# Patient Record
Sex: Male | Born: 1935 | Race: White | Hispanic: No | Marital: Married | Smoking: Never smoker
Health system: Southern US, Community
[De-identification: ages and names within clinical notes are randomized; demographics above are authoritative.]

## PROBLEM LIST (undated history)

## (undated) DIAGNOSIS — I10 Essential (primary) hypertension: Secondary | ICD-10-CM

## (undated) HISTORY — PX: CHOLECYSTECTOMY: SHX55

## (undated) HISTORY — PX: PACEMAKER INSERTION: SHX728

---

## 2003-08-26 ENCOUNTER — Other Ambulatory Visit: Payer: Self-pay

## 2003-12-25 ENCOUNTER — Other Ambulatory Visit: Payer: Self-pay

## 2004-10-20 ENCOUNTER — Emergency Department: Payer: Self-pay | Admitting: Emergency Medicine

## 2004-10-20 ENCOUNTER — Other Ambulatory Visit: Payer: Self-pay

## 2005-01-08 ENCOUNTER — Emergency Department: Payer: Self-pay | Admitting: Emergency Medicine

## 2005-01-09 ENCOUNTER — Other Ambulatory Visit: Payer: Self-pay

## 2005-10-10 ENCOUNTER — Emergency Department: Payer: Self-pay | Admitting: Emergency Medicine

## 2005-11-29 ENCOUNTER — Ambulatory Visit: Payer: Self-pay | Admitting: Gastroenterology

## 2007-01-28 ENCOUNTER — Ambulatory Visit: Payer: Self-pay | Admitting: Cardiology

## 2008-06-27 ENCOUNTER — Inpatient Hospital Stay: Payer: Self-pay | Admitting: Internal Medicine

## 2008-07-15 ENCOUNTER — Emergency Department: Payer: Self-pay | Admitting: Emergency Medicine

## 2010-01-08 ENCOUNTER — Inpatient Hospital Stay: Payer: Self-pay | Admitting: Internal Medicine

## 2010-03-08 ENCOUNTER — Inpatient Hospital Stay: Payer: Self-pay | Admitting: Internal Medicine

## 2011-06-07 ENCOUNTER — Emergency Department: Payer: Self-pay | Admitting: Unknown Physician Specialty

## 2011-06-07 LAB — CBC
MCV: 101 fL — ABNORMAL HIGH (ref 80–100)
Platelet: 325 10*3/uL (ref 150–440)
RBC: 4.25 10*6/uL — ABNORMAL LOW (ref 4.40–5.90)
RDW: 14.4 % (ref 11.5–14.5)
WBC: 6.6 10*3/uL (ref 3.8–10.6)

## 2011-06-07 LAB — URINALYSIS, COMPLETE
Bacteria: NONE SEEN
Blood: NEGATIVE
Ketone: NEGATIVE
Leukocyte Esterase: NEGATIVE
Nitrite: NEGATIVE
Ph: 6 (ref 4.5–8.0)
Protein: NEGATIVE
Specific Gravity: 1.006 (ref 1.003–1.030)
WBC UR: NONE SEEN /HPF (ref 0–5)

## 2011-06-07 LAB — COMPREHENSIVE METABOLIC PANEL
Albumin: 3.7 g/dL (ref 3.4–5.0)
Anion Gap: 12 (ref 7–16)
BUN: 20 mg/dL — ABNORMAL HIGH (ref 7–18)
Bilirubin,Total: 0.7 mg/dL (ref 0.2–1.0)
Calcium, Total: 9.5 mg/dL (ref 8.5–10.1)
Creatinine: 1.12 mg/dL (ref 0.60–1.30)
EGFR (African American): >60
EGFR (Non-African Amer.): >60
Glucose: 102 mg/dL — ABNORMAL HIGH (ref 65–99)
SGOT(AST): 38 U/L — ABNORMAL HIGH (ref 15–37)
SGPT (ALT): 17 U/L
Total Protein: 7.6 g/dL (ref 6.4–8.2)

## 2011-06-07 LAB — PROTIME-INR: Prothrombin Time: 12.4 secs (ref 11.5–14.7)

## 2011-06-07 LAB — LIPASE, BLOOD: Lipase: 203 U/L (ref 73–393)

## 2011-06-07 LAB — APTT: Activated PTT: 27.6 secs (ref 23.6–35.9)

## 2011-06-14 ENCOUNTER — Inpatient Hospital Stay: Payer: Self-pay | Admitting: Internal Medicine

## 2011-06-14 LAB — CBC
HCT: 37.1 % — ABNORMAL LOW (ref 40.0–52.0)
HGB: 12.4 g/dL — ABNORMAL LOW (ref 13.0–18.0)
MCH: 33.1 pg (ref 26.0–34.0)
MCV: 99 fL (ref 80–100)
RBC: 3.74 10*6/uL — ABNORMAL LOW (ref 4.40–5.90)
RDW: 14.3 % (ref 11.5–14.5)
WBC: 4.7 10*3/uL (ref 3.8–10.6)

## 2011-06-14 LAB — BASIC METABOLIC PANEL
BUN: 36 mg/dL — ABNORMAL HIGH (ref 7–18)
Calcium, Total: 9.2 mg/dL (ref 8.5–10.1)
Chloride: 104 mmol/L (ref 98–107)
Co2: 22 mmol/L (ref 21–32)
EGFR (African American): 33 — ABNORMAL LOW
Potassium: 5 mmol/L (ref 3.5–5.1)
Sodium: 139 mmol/L (ref 136–145)

## 2011-06-15 LAB — CBC WITH DIFFERENTIAL/PLATELET
Basophil #: 0 10*3/uL (ref 0.0–0.1)
Basophil %: 0.4 %
Eosinophil #: 0.1 10*3/uL (ref 0.0–0.7)
HCT: 37.7 % — ABNORMAL LOW (ref 40.0–52.0)
HGB: 12.3 g/dL — ABNORMAL LOW (ref 13.0–18.0)
Lymphocyte %: 21.6 %
MCH: 32.8 pg (ref 26.0–34.0)
MCHC: 32.7 g/dL (ref 32.0–36.0)
Monocyte #: 0.7 10*3/uL (ref 0.0–0.7)
Monocyte %: 13.9 %
Neutrophil #: 3.1 10*3/uL (ref 1.4–6.5)
Neutrophil %: 61.9 %
Platelet: 191 10*3/uL (ref 150–440)
RBC: 3.75 10*6/uL — ABNORMAL LOW (ref 4.40–5.90)
RDW: 14.4 % (ref 11.5–14.5)

## 2011-06-15 LAB — TROPONIN I: Troponin-I: 0.03 ng/mL

## 2011-06-15 LAB — BASIC METABOLIC PANEL
Anion Gap: 10 (ref 7–16)
BUN: 33 mg/dL — ABNORMAL HIGH (ref 7–18)
Calcium, Total: 9.2 mg/dL (ref 8.5–10.1)
Chloride: 107 mmol/L (ref 98–107)
Co2: 24 mmol/L (ref 21–32)
EGFR (Non-African Amer.): 29 — ABNORMAL LOW
Osmolality: 288 (ref 275–301)
Potassium: 4.8 mmol/L (ref 3.5–5.1)

## 2011-06-15 LAB — LIPID PANEL
Cholesterol: 130 mg/dL (ref 0–200)
HDL Cholesterol: 38 mg/dL — ABNORMAL LOW (ref 40–60)
Ldl Cholesterol, Calc: 75 mg/dL (ref 0–100)
VLDL Cholesterol, Calc: 17 mg/dL (ref 5–40)

## 2011-06-16 LAB — CBC WITH DIFFERENTIAL/PLATELET
Basophil #: 0 10*3/uL (ref 0.0–0.1)
Eosinophil #: 0.2 10*3/uL (ref 0.0–0.7)
HCT: 37.1 % — ABNORMAL LOW (ref 40.0–52.0)
Lymphocyte #: 1.2 10*3/uL (ref 1.0–3.6)
MCH: 32.9 pg (ref 26.0–34.0)
MCHC: 32.5 g/dL (ref 32.0–36.0)
MCV: 101 fL — ABNORMAL HIGH (ref 80–100)
Monocyte #: 0.6 10*3/uL (ref 0.0–0.7)
Neutrophil #: 1.9 10*3/uL (ref 1.4–6.5)
RDW: 14.1 % (ref 11.5–14.5)

## 2011-06-16 LAB — BASIC METABOLIC PANEL
BUN: 26 mg/dL — ABNORMAL HIGH (ref 7–18)
Calcium, Total: 9.4 mg/dL (ref 8.5–10.1)
Co2: 23 mmol/L (ref 21–32)
Creatinine: 1.45 mg/dL — ABNORMAL HIGH (ref 0.60–1.30)
EGFR (African American): 59 — ABNORMAL LOW
EGFR (Non-African Amer.): 48 — ABNORMAL LOW
Glucose: 98 mg/dL (ref 65–99)
Potassium: 4.5 mmol/L (ref 3.5–5.1)
Sodium: 141 mmol/L (ref 136–145)

## 2011-06-17 LAB — BASIC METABOLIC PANEL
BUN: 21 mg/dL — ABNORMAL HIGH (ref 7–18)
Calcium, Total: 9.4 mg/dL (ref 8.5–10.1)
Chloride: 109 mmol/L — ABNORMAL HIGH (ref 98–107)
Creatinine: 1.29 mg/dL (ref 0.60–1.30)
EGFR (African American): >60
EGFR (Non-African Amer.): 55 — ABNORMAL LOW
Glucose: 98 mg/dL (ref 65–99)
Osmolality: 282 (ref 275–301)
Potassium: 4.7 mmol/L (ref 3.5–5.1)
Sodium: 140 mmol/L (ref 136–145)

## 2011-10-07 ENCOUNTER — Emergency Department: Payer: Self-pay | Admitting: *Deleted

## 2011-10-07 LAB — CBC
HGB: 12.4 g/dL — ABNORMAL LOW (ref 13.0–18.0)
MCH: 32.7 pg (ref 26.0–34.0)
MCHC: 33.4 g/dL (ref 32.0–36.0)
Platelet: 213 10*3/uL (ref 150–440)
RBC: 3.79 10*6/uL — ABNORMAL LOW (ref 4.40–5.90)
RDW: 13.8 % (ref 11.5–14.5)

## 2011-10-07 LAB — TROPONIN I: Troponin-I: 0.03 ng/mL

## 2011-10-07 LAB — COMPREHENSIVE METABOLIC PANEL
Albumin: 3.4 g/dL (ref 3.4–5.0)
Alkaline Phosphatase: 110 U/L (ref 50–136)
Chloride: 105 mmol/L (ref 98–107)
Creatinine: 1.14 mg/dL (ref 0.60–1.30)
EGFR (African American): >60
Glucose: 94 mg/dL (ref 65–99)
Potassium: 4.2 mmol/L (ref 3.5–5.1)
SGOT(AST): 26 U/L (ref 15–37)
SGPT (ALT): 17 U/L
Sodium: 139 mmol/L (ref 136–145)
Total Protein: 7.4 g/dL (ref 6.4–8.2)

## 2011-10-10 ENCOUNTER — Emergency Department: Payer: Self-pay | Admitting: Emergency Medicine

## 2011-10-10 LAB — CK TOTAL AND CKMB (NOT AT ARMC)
CK, Total: 59 U/L (ref 35–232)
CK-MB: 1.8 ng/mL (ref 0.5–3.6)

## 2011-10-10 LAB — COMPREHENSIVE METABOLIC PANEL
Alkaline Phosphatase: 111 U/L (ref 50–136)
Bilirubin,Total: 0.8 mg/dL (ref 0.2–1.0)
Calcium, Total: 9.4 mg/dL (ref 8.5–10.1)
Chloride: 105 mmol/L (ref 98–107)
Co2: 23 mmol/L (ref 21–32)
Creatinine: 1.25 mg/dL (ref 0.60–1.30)
EGFR (African American): 57 — ABNORMAL LOW
Osmolality: 277 (ref 275–301)
SGOT(AST): 28 U/L (ref 15–37)
SGPT (ALT): 21 U/L

## 2011-10-10 LAB — CBC
HCT: 38.2 % — ABNORMAL LOW (ref 40.0–52.0)
RBC: 3.84 10*6/uL — ABNORMAL LOW (ref 4.40–5.90)
RDW: 13.5 % (ref 11.5–14.5)
WBC: 6 10*3/uL (ref 3.8–10.6)

## 2011-10-10 LAB — PRO B NATRIURETIC PEPTIDE: B-Type Natriuretic Peptide: 4641 pg/mL — ABNORMAL HIGH (ref 0–450)

## 2012-03-13 ENCOUNTER — Observation Stay: Payer: Self-pay | Admitting: Family Medicine

## 2012-03-13 LAB — CBC
Platelet: 209 10*3/uL (ref 150–440)
RBC: 3.65 10*6/uL — ABNORMAL LOW (ref 4.40–5.90)
RDW: 15 % — ABNORMAL HIGH (ref 11.5–14.5)
WBC: 5.5 10*3/uL (ref 3.8–10.6)

## 2012-03-13 LAB — URINALYSIS, COMPLETE
Bilirubin,UR: NEGATIVE
Glucose,UR: NEGATIVE mg/dL (ref 0–75)
Leukocyte Esterase: NEGATIVE
Ph: 5 (ref 4.5–8.0)
Specific Gravity: 1.017 (ref 1.003–1.030)
Squamous Epithelial: NONE SEEN
WBC UR: <1 /HPF (ref 0–5)

## 2012-03-13 LAB — BASIC METABOLIC PANEL
BUN: 15 mg/dL (ref 7–18)
Chloride: 106 mmol/L (ref 98–107)
EGFR (Non-African Amer.): 49 — ABNORMAL LOW
Glucose: 102 mg/dL — ABNORMAL HIGH (ref 65–99)
Osmolality: 277 (ref 275–301)
Potassium: 4.4 mmol/L (ref 3.5–5.1)
Sodium: 138 mmol/L (ref 136–145)

## 2012-03-13 LAB — HEPATIC FUNCTION PANEL A (ARMC)
Albumin: 3.3 g/dL — ABNORMAL LOW (ref 3.4–5.0)
Alkaline Phosphatase: 101 U/L (ref 50–136)
Bilirubin,Total: 0.6 mg/dL (ref 0.2–1.0)
SGOT(AST): 24 U/L (ref 15–37)
SGPT (ALT): 16 U/L (ref 12–78)
Total Protein: 7 g/dL (ref 6.4–8.2)

## 2012-03-13 LAB — CK TOTAL AND CKMB (NOT AT ARMC)
CK, Total: 55 U/L (ref 35–232)
CK, Total: 40 U/L (ref 35–232)
CK-MB: 1.6 ng/mL (ref 0.5–3.6)

## 2012-03-13 LAB — LIPASE, BLOOD: Lipase: 140 U/L (ref 73–393)

## 2012-03-14 LAB — CK TOTAL AND CKMB (NOT AT ARMC)
CK, Total: 40 U/L (ref 35–232)
CK-MB: 1.9 ng/mL (ref 0.5–3.6)

## 2012-03-14 LAB — CBC WITH DIFFERENTIAL/PLATELET
Basophil %: 0.6 %
Eosinophil %: 3.4 %
HCT: 34.6 % — ABNORMAL LOW (ref 40.0–52.0)
HGB: 11.7 g/dL — ABNORMAL LOW (ref 13.0–18.0)
Lymphocyte #: 1 10*3/uL (ref 1.0–3.6)
MCH: 33.6 pg (ref 26.0–34.0)
MCV: 100 fL (ref 80–100)
Monocyte #: 0.7 x10 3/mm (ref 0.2–1.0)
Monocyte %: 15.3 %
Neutrophil #: 2.5 10*3/uL (ref 1.4–6.5)
Neutrophil %: 58.4 %
RBC: 3.48 10*6/uL — ABNORMAL LOW (ref 4.40–5.90)
WBC: 4.3 10*3/uL (ref 3.8–10.6)

## 2012-03-14 LAB — BASIC METABOLIC PANEL
Anion Gap: 7 (ref 7–16)
Calcium, Total: 8.5 mg/dL (ref 8.5–10.1)
Chloride: 107 mmol/L (ref 98–107)
Co2: 25 mmol/L (ref 21–32)
EGFR (Non-African Amer.): 47 — ABNORMAL LOW
Glucose: 86 mg/dL (ref 65–99)
Osmolality: 278 (ref 275–301)
Potassium: 4.2 mmol/L (ref 3.5–5.1)
Sodium: 139 mmol/L (ref 136–145)

## 2012-03-14 LAB — TROPONIN I: Troponin-I: 0.08 ng/mL — ABNORMAL HIGH

## 2012-05-02 LAB — CBC
HCT: 34.2 % — ABNORMAL LOW (ref 40.0–52.0)
MCHC: 34.4 g/dL (ref 32.0–36.0)
Platelet: 300 10*3/uL (ref 150–440)
RBC: 3.45 10*6/uL — ABNORMAL LOW (ref 4.40–5.90)
RDW: 14.1 % (ref 11.5–14.5)

## 2012-05-02 LAB — COMPREHENSIVE METABOLIC PANEL
Albumin: 3.1 g/dL — ABNORMAL LOW (ref 3.4–5.0)
Anion Gap: 7 (ref 7–16)
BUN: 16 mg/dL (ref 7–18)
Bilirubin,Total: 0.6 mg/dL (ref 0.2–1.0)
Chloride: 101 mmol/L (ref 98–107)
Creatinine: 1.31 mg/dL — ABNORMAL HIGH (ref 0.60–1.30)
EGFR (African American): 54 — ABNORMAL LOW
EGFR (Non-African Amer.): 46 — ABNORMAL LOW
Glucose: 103 mg/dL — ABNORMAL HIGH (ref 65–99)
Osmolality: 277 (ref 275–301)
Potassium: 3.8 mmol/L (ref 3.5–5.1)
SGOT(AST): 23 U/L (ref 15–37)
SGPT (ALT): 15 U/L (ref 12–78)
Sodium: 138 mmol/L (ref 136–145)
Total Protein: 7.2 g/dL (ref 6.4–8.2)

## 2012-05-03 ENCOUNTER — Ambulatory Visit: Payer: Self-pay | Admitting: Orthopedic Surgery

## 2012-05-04 ENCOUNTER — Inpatient Hospital Stay: Payer: Self-pay

## 2012-05-04 LAB — COMPREHENSIVE METABOLIC PANEL
Albumin: 2.4 g/dL — ABNORMAL LOW (ref 3.4–5.0)
Anion Gap: 5 — ABNORMAL LOW (ref 7–16)
Calcium, Total: 8.7 mg/dL (ref 8.5–10.1)
Chloride: 103 mmol/L (ref 98–107)
Co2: 30 mmol/L (ref 21–32)
EGFR (African American): 58 — ABNORMAL LOW
Glucose: 103 mg/dL — ABNORMAL HIGH (ref 65–99)
Osmolality: 277 (ref 275–301)
Potassium: 3.8 mmol/L (ref 3.5–5.1)
SGOT(AST): 16 U/L (ref 15–37)
SGPT (ALT): 13 U/L (ref 12–78)

## 2012-05-04 LAB — CBC WITH DIFFERENTIAL/PLATELET
Eosinophil %: 5.9 %
Lymphocyte %: 16.9 %
Monocyte #: 0.9 x10 3/mm (ref 0.2–1.0)
Monocyte %: 14.1 %
Neutrophil #: 3.8 10*3/uL (ref 1.4–6.5)
Neutrophil %: 62.6 %
Platelet: 231 10*3/uL (ref 150–440)
WBC: 6.1 10*3/uL (ref 3.8–10.6)

## 2012-05-04 LAB — MAGNESIUM: Magnesium: 1.8 mg/dL

## 2012-05-05 LAB — CBC WITH DIFFERENTIAL/PLATELET
Basophil #: 0 10*3/uL (ref 0.0–0.1)
Basophil %: 0.4 %
Eosinophil #: 0.2 10*3/uL (ref 0.0–0.7)
HCT: 27.9 % — ABNORMAL LOW (ref 40.0–52.0)
HGB: 9.6 g/dL — ABNORMAL LOW (ref 13.0–18.0)
Lymphocyte %: 17 %
MCH: 33.9 pg (ref 26.0–34.0)
MCHC: 34.2 g/dL (ref 32.0–36.0)
Monocyte #: 0.9 x10 3/mm (ref 0.2–1.0)
Monocyte %: 14.7 %
Neutrophil #: 4 10*3/uL (ref 1.4–6.5)
Neutrophil %: 65.1 %
Platelet: 232 10*3/uL (ref 150–440)
WBC: 6.1 10*3/uL (ref 3.8–10.6)

## 2012-05-06 LAB — BASIC METABOLIC PANEL
BUN: 16 mg/dL (ref 7–18)
Calcium, Total: 8.9 mg/dL (ref 8.5–10.1)
Chloride: 101 mmol/L (ref 98–107)
Co2: 26 mmol/L (ref 21–32)
Creatinine: 1.02 mg/dL (ref 0.60–1.30)
EGFR (Non-African Amer.): >60
Osmolality: 267 (ref 275–301)
Potassium: 4.3 mmol/L (ref 3.5–5.1)
Sodium: 133 mmol/L — ABNORMAL LOW (ref 136–145)

## 2012-05-06 LAB — CBC WITH DIFFERENTIAL/PLATELET
Basophil %: 0.4 %
Eosinophil #: 0.2 10*3/uL (ref 0.0–0.7)
HCT: 27.4 % — ABNORMAL LOW (ref 40.0–52.0)
Lymphocyte %: 12.7 %
MCH: 34.4 pg — ABNORMAL HIGH (ref 26.0–34.0)
MCV: 99 fL (ref 80–100)
Monocyte %: 13.8 %
Platelet: 236 10*3/uL (ref 150–440)
RBC: 2.78 10*6/uL — ABNORMAL LOW (ref 4.40–5.90)
RDW: 13.6 % (ref 11.5–14.5)

## 2012-05-07 LAB — COMPREHENSIVE METABOLIC PANEL
Alkaline Phosphatase: 213 U/L — ABNORMAL HIGH (ref 50–136)
BUN: 18 mg/dL (ref 7–18)
Bilirubin,Total: 0.8 mg/dL (ref 0.2–1.0)
Chloride: 100 mmol/L (ref 98–107)
Creatinine: 1.09 mg/dL (ref 0.60–1.30)
SGPT (ALT): 11 U/L — ABNORMAL LOW (ref 12–78)
Sodium: 134 mmol/L — ABNORMAL LOW (ref 136–145)
Total Protein: 5.7 g/dL — ABNORMAL LOW (ref 6.4–8.2)

## 2012-05-07 LAB — CBC WITH DIFFERENTIAL/PLATELET
Basophil #: 0.1 10*3/uL (ref 0.0–0.1)
Basophil %: 1.2 %
Lymphocyte %: 11.5 %
MCH: 35.7 pg — ABNORMAL HIGH (ref 26.0–34.0)
MCHC: 36.1 g/dL — ABNORMAL HIGH (ref 32.0–36.0)
Monocyte %: 13.9 %
Neutrophil #: 3.7 10*3/uL (ref 1.4–6.5)
Neutrophil %: 71.5 %
Platelet: 260 10*3/uL (ref 150–440)
RBC: 2.84 10*6/uL — ABNORMAL LOW (ref 4.40–5.90)
RDW: 13.7 % (ref 11.5–14.5)
WBC: 5.2 10*3/uL (ref 3.8–10.6)

## 2012-05-09 ENCOUNTER — Encounter: Payer: Self-pay | Admitting: Internal Medicine

## 2012-06-01 ENCOUNTER — Encounter: Payer: Self-pay | Admitting: Internal Medicine

## 2012-06-01 LAB — URINALYSIS, COMPLETE
Bacteria: NONE SEEN
Bilirubin,UR: NEGATIVE
Glucose,UR: NEGATIVE mg/dL (ref 0–75)
Hyaline Cast: 7
Ketone: NEGATIVE
Leukocyte Esterase: NEGATIVE
Nitrite: NEGATIVE
Ph: 5 (ref 4.5–8.0)
Protein: NEGATIVE
RBC,UR: <1 /HPF (ref 0–5)

## 2012-06-11 LAB — CBC WITH DIFFERENTIAL/PLATELET
Basophil %: 0.6 %
HCT: 38.7 % — ABNORMAL LOW (ref 40.0–52.0)
MCHC: 33.6 g/dL (ref 32.0–36.0)
MCV: 99 fL (ref 80–100)
Neutrophil %: 62.5 %
Platelet: 272 10*3/uL (ref 150–440)
RDW: 14.4 % (ref 11.5–14.5)
WBC: 5.3 10*3/uL (ref 3.8–10.6)

## 2012-06-11 LAB — BASIC METABOLIC PANEL
BUN: 20 mg/dL — ABNORMAL HIGH (ref 7–18)
Calcium, Total: 9.9 mg/dL (ref 8.5–10.1)
Chloride: 103 mmol/L (ref 98–107)
Creatinine: 1.43 mg/dL — ABNORMAL HIGH (ref 0.60–1.30)
EGFR (Non-African Amer.): 42 — ABNORMAL LOW
Osmolality: 275 (ref 275–301)
Potassium: 4.1 mmol/L (ref 3.5–5.1)
Sodium: 136 mmol/L (ref 136–145)

## 2012-06-13 ENCOUNTER — Emergency Department: Payer: Self-pay | Admitting: Emergency Medicine

## 2012-06-13 LAB — TROPONIN I: Troponin-I: 0.04 ng/mL

## 2012-06-13 LAB — CBC
HCT: 39.2 % — ABNORMAL LOW (ref 40.0–52.0)
MCH: 32.8 pg (ref 26.0–34.0)
MCHC: 33.2 g/dL (ref 32.0–36.0)
Platelet: 296 10*3/uL (ref 150–440)
RDW: 14.6 % — ABNORMAL HIGH (ref 11.5–14.5)
WBC: 6.3 10*3/uL (ref 3.8–10.6)

## 2012-06-13 LAB — COMPREHENSIVE METABOLIC PANEL
Albumin: 3.3 g/dL — ABNORMAL LOW (ref 3.4–5.0)
Alkaline Phosphatase: 218 U/L — ABNORMAL HIGH (ref 50–136)
Calcium, Total: 9.8 mg/dL (ref 8.5–10.1)
Chloride: 104 mmol/L (ref 98–107)
Co2: 27 mmol/L (ref 21–32)
Creatinine: 1.47 mg/dL — ABNORMAL HIGH (ref 0.60–1.30)
EGFR (African American): 47 — ABNORMAL LOW
Glucose: 112 mg/dL — ABNORMAL HIGH (ref 65–99)
Osmolality: 280 (ref 275–301)
SGOT(AST): 25 U/L (ref 15–37)
Sodium: 138 mmol/L (ref 136–145)

## 2012-06-13 LAB — PROTIME-INR: Prothrombin Time: 14.4 secs (ref 11.5–14.7)

## 2012-06-13 LAB — CK TOTAL AND CKMB (NOT AT ARMC): CK-MB: <0.5 ng/mL — ABNORMAL LOW (ref 0.5–3.6)

## 2012-06-20 ENCOUNTER — Encounter: Payer: Self-pay | Admitting: Internal Medicine

## 2012-09-05 ENCOUNTER — Emergency Department: Payer: Self-pay | Admitting: Unknown Physician Specialty

## 2012-09-06 LAB — CBC
HCT: 38.2 % — ABNORMAL LOW (ref 40.0–52.0)
HGB: 12.7 g/dL — ABNORMAL LOW (ref 13.0–18.0)
MCH: 33.3 pg (ref 26.0–34.0)
MCHC: 33.2 g/dL (ref 32.0–36.0)
Platelet: 216 10*3/uL (ref 150–440)
RDW: 16.4 % — ABNORMAL HIGH (ref 11.5–14.5)
WBC: 9.4 10*3/uL (ref 3.8–10.6)

## 2012-09-06 LAB — COMPREHENSIVE METABOLIC PANEL
Alkaline Phosphatase: 148 U/L — ABNORMAL HIGH (ref 50–136)
Anion Gap: 7 (ref 7–16)
BUN: 19 mg/dL — ABNORMAL HIGH (ref 7–18)
Calcium, Total: 9.5 mg/dL (ref 8.5–10.1)
Creatinine: 1.73 mg/dL — ABNORMAL HIGH (ref 0.60–1.30)
Glucose: 125 mg/dL — ABNORMAL HIGH (ref 65–99)
Osmolality: 285 (ref 275–301)
SGOT(AST): 17 U/L (ref 15–37)
SGPT (ALT): 12 U/L (ref 12–78)

## 2012-12-29 ENCOUNTER — Emergency Department: Payer: Self-pay | Admitting: Unknown Physician Specialty

## 2012-12-29 LAB — BASIC METABOLIC PANEL
Anion Gap: 8 (ref 7–16)
Calcium, Total: 9.5 mg/dL (ref 8.5–10.1)
Chloride: 106 mmol/L (ref 98–107)
Co2: 28 mmol/L (ref 21–32)
Creatinine: 2.21 mg/dL — ABNORMAL HIGH (ref 0.60–1.30)
EGFR (African American): 28 — ABNORMAL LOW
EGFR (Non-African Amer.): 24 — ABNORMAL LOW
Glucose: 105 mg/dL — ABNORMAL HIGH (ref 65–99)
Potassium: 4.3 mmol/L (ref 3.5–5.1)
Sodium: 142 mmol/L (ref 136–145)

## 2012-12-29 LAB — CBC
HCT: 32.5 % — ABNORMAL LOW (ref 40.0–52.0)
MCH: 32.8 pg (ref 26.0–34.0)
MCV: 96 fL (ref 80–100)
Platelet: 252 10*3/uL (ref 150–440)
RDW: 14.9 % — ABNORMAL HIGH (ref 11.5–14.5)
WBC: 6 10*3/uL (ref 3.8–10.6)

## 2012-12-29 LAB — PROTIME-INR: Prothrombin Time: 13.2 secs (ref 11.5–14.7)

## 2013-10-14 ENCOUNTER — Inpatient Hospital Stay: Payer: Self-pay

## 2013-10-14 LAB — CBC
HCT: 23.1 % — ABNORMAL LOW (ref 40.0–52.0)
HGB: 7.5 g/dL — AB (ref 13.0–18.0)
MCH: 32 pg (ref 26.0–34.0)
MCHC: 32.6 g/dL (ref 32.0–36.0)
MCV: 98 fL (ref 80–100)
PLATELETS: 176 10*3/uL (ref 150–440)
RBC: 2.35 10*6/uL — ABNORMAL LOW (ref 4.40–5.90)
RDW: 15.7 % — ABNORMAL HIGH (ref 11.5–14.5)
WBC: 6.6 10*3/uL (ref 3.8–10.6)

## 2013-10-14 LAB — COMPREHENSIVE METABOLIC PANEL
Albumin: 3 g/dL — ABNORMAL LOW (ref 3.4–5.0)
Alkaline Phosphatase: 137 U/L — ABNORMAL HIGH
Anion Gap: 7 (ref 7–16)
BILIRUBIN TOTAL: 0.6 mg/dL (ref 0.2–1.0)
BUN: 34 mg/dL — ABNORMAL HIGH (ref 7–18)
Calcium, Total: 8.8 mg/dL (ref 8.5–10.1)
Chloride: 108 mmol/L — ABNORMAL HIGH (ref 98–107)
Co2: 24 mmol/L (ref 21–32)
Creatinine: 1.84 mg/dL — ABNORMAL HIGH (ref 0.60–1.30)
GFR CALC AF AMER: 35 — AB
GFR CALC NON AF AMER: 30 — AB
Glucose: 96 mg/dL (ref 65–99)
Osmolality: 285 (ref 275–301)
Potassium: 3.8 mmol/L (ref 3.5–5.1)
SGOT(AST): 56 U/L — ABNORMAL HIGH (ref 15–37)
SGPT (ALT): 70 U/L (ref 12–78)
SODIUM: 139 mmol/L (ref 136–145)
Total Protein: 6.2 g/dL — ABNORMAL LOW (ref 6.4–8.2)

## 2013-10-14 LAB — APTT: ACTIVATED PTT: 28.5 s (ref 23.6–35.9)

## 2013-10-14 LAB — PROTIME-INR
INR: 1.2
Prothrombin Time: 14.9 secs — ABNORMAL HIGH (ref 11.5–14.7)

## 2013-10-14 LAB — HEMATOCRIT: HCT: 25.8 % — ABNORMAL LOW (ref 40.0–52.0)

## 2013-10-14 LAB — HEMOGLOBIN: HGB: 8.5 g/dL — ABNORMAL LOW (ref 13.0–18.0)

## 2013-10-15 LAB — CBC WITH DIFFERENTIAL/PLATELET
Basophil #: 0 10*3/uL (ref 0.0–0.1)
Basophil %: 0.4 %
EOS PCT: 3.3 %
Eosinophil #: 0.2 10*3/uL (ref 0.0–0.7)
HCT: 22.4 % — ABNORMAL LOW (ref 40.0–52.0)
HGB: 7.5 g/dL — ABNORMAL LOW (ref 13.0–18.0)
LYMPHS PCT: 15.5 %
Lymphocyte #: 1 10*3/uL (ref 1.0–3.6)
MCH: 31.6 pg (ref 26.0–34.0)
MCHC: 33.6 g/dL (ref 32.0–36.0)
MCV: 94 fL (ref 80–100)
MONO ABS: 1.1 x10 3/mm — AB (ref 0.2–1.0)
Monocyte %: 16 %
NEUTROS PCT: 64.8 %
Neutrophil #: 4.3 10*3/uL (ref 1.4–6.5)
Platelet: 157 10*3/uL (ref 150–440)
RBC: 2.38 10*6/uL — ABNORMAL LOW (ref 4.40–5.90)
RDW: 17.6 % — AB (ref 11.5–14.5)
WBC: 6.7 10*3/uL (ref 3.8–10.6)

## 2013-10-15 LAB — BASIC METABOLIC PANEL
ANION GAP: 6 — AB (ref 7–16)
BUN: 32 mg/dL — ABNORMAL HIGH (ref 7–18)
Calcium, Total: 8.2 mg/dL — ABNORMAL LOW (ref 8.5–10.1)
Chloride: 110 mmol/L — ABNORMAL HIGH (ref 98–107)
Co2: 25 mmol/L (ref 21–32)
Creatinine: 1.67 mg/dL — ABNORMAL HIGH (ref 0.60–1.30)
EGFR (Non-African Amer.): 34 — ABNORMAL LOW
GFR CALC AF AMER: 39 — AB
Glucose: 105 mg/dL — ABNORMAL HIGH (ref 65–99)
Osmolality: 289 (ref 275–301)
Potassium: 3.7 mmol/L (ref 3.5–5.1)
Sodium: 141 mmol/L (ref 136–145)

## 2013-10-15 LAB — MAGNESIUM: Magnesium: 1.9 mg/dL

## 2013-10-15 LAB — HEMOGLOBIN
HGB: 7.7 g/dL — ABNORMAL LOW (ref 13.0–18.0)
HGB: 8 g/dL — ABNORMAL LOW (ref 13.0–18.0)

## 2013-10-16 LAB — HEMOGLOBIN
HGB: 6.9 g/dL — ABNORMAL LOW (ref 13.0–18.0)
HGB: 9.2 g/dL — AB (ref 13.0–18.0)

## 2013-10-17 LAB — BASIC METABOLIC PANEL
ANION GAP: 6 — AB (ref 7–16)
BUN: 18 mg/dL (ref 7–18)
CO2: 22 mmol/L (ref 21–32)
CREATININE: 1.22 mg/dL (ref 0.60–1.30)
Calcium, Total: 8.3 mg/dL — ABNORMAL LOW (ref 8.5–10.1)
Chloride: 112 mmol/L — ABNORMAL HIGH (ref 98–107)
EGFR (African American): 58 — ABNORMAL LOW
GFR CALC NON AF AMER: 50 — AB
Glucose: 95 mg/dL (ref 65–99)
OSMOLALITY: 281 (ref 275–301)
POTASSIUM: 3.9 mmol/L (ref 3.5–5.1)
SODIUM: 140 mmol/L (ref 136–145)

## 2013-10-17 LAB — HEMOGLOBIN: HGB: 7.7 g/dL — ABNORMAL LOW (ref 13.0–18.0)

## 2013-10-18 ENCOUNTER — Ambulatory Visit: Payer: Self-pay | Admitting: Internal Medicine

## 2013-10-18 LAB — HEMOGLOBIN
HGB: 8.3 g/dL — ABNORMAL LOW (ref 13.0–18.0)
HGB: 8.7 g/dL — AB (ref 13.0–18.0)

## 2013-10-18 LAB — BASIC METABOLIC PANEL
ANION GAP: 5 — AB (ref 7–16)
BUN: 17 mg/dL (ref 7–18)
CALCIUM: 8.5 mg/dL (ref 8.5–10.1)
Chloride: 116 mmol/L — ABNORMAL HIGH (ref 98–107)
Co2: 20 mmol/L — ABNORMAL LOW (ref 21–32)
Creatinine: 1.23 mg/dL (ref 0.60–1.30)
GFR CALC AF AMER: 57 — AB
GFR CALC NON AF AMER: 49 — AB
Glucose: 101 mg/dL — ABNORMAL HIGH (ref 65–99)
Osmolality: 283 (ref 275–301)
POTASSIUM: 4 mmol/L (ref 3.5–5.1)
Sodium: 141 mmol/L (ref 136–145)

## 2013-10-19 LAB — HEMOGLOBIN: HGB: 8 g/dL — ABNORMAL LOW (ref 13.0–18.0)

## 2013-10-20 LAB — HEMOGLOBIN: HGB: 8 g/dL — ABNORMAL LOW (ref 13.0–18.0)

## 2013-10-20 LAB — BASIC METABOLIC PANEL
ANION GAP: 6 — AB (ref 7–16)
BUN: 15 mg/dL (ref 7–18)
CHLORIDE: 113 mmol/L — AB (ref 98–107)
Calcium, Total: 8.8 mg/dL (ref 8.5–10.1)
Co2: 21 mmol/L (ref 21–32)
Creatinine: 1.31 mg/dL — ABNORMAL HIGH (ref 0.60–1.30)
EGFR (Non-African Amer.): 46 — ABNORMAL LOW
GFR CALC AF AMER: 53 — AB
Glucose: 112 mg/dL — ABNORMAL HIGH (ref 65–99)
OSMOLALITY: 281 (ref 275–301)
POTASSIUM: 4 mmol/L (ref 3.5–5.1)
Sodium: 140 mmol/L (ref 136–145)

## 2013-10-21 ENCOUNTER — Inpatient Hospital Stay: Payer: Self-pay | Admitting: Internal Medicine

## 2013-10-21 DIAGNOSIS — K922 Gastrointestinal hemorrhage, unspecified: Secondary | ICD-10-CM

## 2013-10-21 DIAGNOSIS — F3289 Other specified depressive episodes: Secondary | ICD-10-CM

## 2013-10-21 DIAGNOSIS — R404 Transient alteration of awareness: Secondary | ICD-10-CM

## 2013-10-21 DIAGNOSIS — I2510 Atherosclerotic heart disease of native coronary artery without angina pectoris: Secondary | ICD-10-CM

## 2013-10-21 DIAGNOSIS — R609 Edema, unspecified: Secondary | ICD-10-CM

## 2013-10-21 DIAGNOSIS — F329 Major depressive disorder, single episode, unspecified: Secondary | ICD-10-CM

## 2013-10-21 LAB — CBC WITH DIFFERENTIAL/PLATELET
BASOS ABS: 0 10*3/uL (ref 0.0–0.1)
Basophil %: 0.4 %
EOS PCT: 0 %
Eosinophil #: 0 10*3/uL (ref 0.0–0.7)
HCT: 26.5 % — AB (ref 40.0–52.0)
HGB: 8.4 g/dL — AB (ref 13.0–18.0)
LYMPHS PCT: 1.9 %
Lymphocyte #: 0.2 10*3/uL — ABNORMAL LOW (ref 1.0–3.6)
MCH: 30.9 pg (ref 26.0–34.0)
MCHC: 31.8 g/dL — ABNORMAL LOW (ref 32.0–36.0)
MCV: 97 fL (ref 80–100)
Monocyte #: 0.8 x10 3/mm (ref 0.2–1.0)
Monocyte %: 7.2 %
Neutrophil #: 10.3 10*3/uL — ABNORMAL HIGH (ref 1.4–6.5)
Neutrophil %: 90.5 %
PLATELETS: 272 10*3/uL (ref 150–440)
RBC: 2.73 10*6/uL — ABNORMAL LOW (ref 4.40–5.90)
RDW: 18.9 % — ABNORMAL HIGH (ref 11.5–14.5)
WBC: 11.4 10*3/uL — ABNORMAL HIGH (ref 3.8–10.6)

## 2013-10-21 LAB — URINALYSIS, COMPLETE
BILIRUBIN, UR: NEGATIVE
Blood: NEGATIVE
Glucose,UR: NEGATIVE mg/dL (ref 0–75)
Granular Cast: 2
Hyaline Cast: 15
KETONE: NEGATIVE
LEUKOCYTE ESTERASE: NEGATIVE
NITRITE: NEGATIVE
Ph: 5 (ref 4.5–8.0)
Protein: NEGATIVE
RBC,UR: <1 /HPF (ref 0–5)
Specific Gravity: 1.008 (ref 1.003–1.030)
Squamous Epithelial: NONE SEEN
WBC UR: <1 /HPF (ref 0–5)

## 2013-10-21 LAB — COMPREHENSIVE METABOLIC PANEL
ALT: 33 U/L (ref 12–78)
ANION GAP: 8 (ref 7–16)
AST: 59 U/L — AB (ref 15–37)
Albumin: 2.4 g/dL — ABNORMAL LOW (ref 3.4–5.0)
Alkaline Phosphatase: 227 U/L — ABNORMAL HIGH
BUN: 20 mg/dL — ABNORMAL HIGH (ref 7–18)
Bilirubin,Total: 1.8 mg/dL — ABNORMAL HIGH (ref 0.2–1.0)
CHLORIDE: 113 mmol/L — AB (ref 98–107)
Calcium, Total: 9.1 mg/dL (ref 8.5–10.1)
Co2: 19 mmol/L — ABNORMAL LOW (ref 21–32)
Creatinine: 1.47 mg/dL — ABNORMAL HIGH (ref 0.60–1.30)
EGFR (Non-African Amer.): 40 — ABNORMAL LOW
GFR CALC AF AMER: 46 — AB
GLUCOSE: 102 mg/dL — AB (ref 65–99)
OSMOLALITY: 282 (ref 275–301)
POTASSIUM: 4.1 mmol/L (ref 3.5–5.1)
Sodium: 140 mmol/L (ref 136–145)
Total Protein: 5.9 g/dL — ABNORMAL LOW (ref 6.4–8.2)

## 2013-10-21 LAB — TROPONIN I: Troponin-I: 0.03 ng/mL

## 2013-10-21 LAB — TSH: Thyroid Stimulating Horm: 1.84 u[IU]/mL

## 2013-10-21 LAB — PRO B NATRIURETIC PEPTIDE: B-TYPE NATIURETIC PEPTID: 10177 pg/mL — AB (ref 0–450)

## 2013-10-23 LAB — CBC WITH DIFFERENTIAL/PLATELET
BASOS ABS: 0 10*3/uL (ref 0.0–0.1)
BASOS PCT: 0.1 %
Eosinophil #: 0 10*3/uL (ref 0.0–0.7)
Eosinophil %: 0.1 %
HCT: 23.7 % — ABNORMAL LOW (ref 40.0–52.0)
HGB: 7.7 g/dL — ABNORMAL LOW (ref 13.0–18.0)
LYMPHS ABS: 0.4 10*3/uL — AB (ref 1.0–3.6)
LYMPHS PCT: 4 %
MCH: 31 pg (ref 26.0–34.0)
MCHC: 32.3 g/dL (ref 32.0–36.0)
MCV: 96 fL (ref 80–100)
MONO ABS: 0.7 x10 3/mm (ref 0.2–1.0)
MONOS PCT: 7 %
NEUTROS PCT: 88.8 %
Neutrophil #: 8.3 10*3/uL — ABNORMAL HIGH (ref 1.4–6.5)
PLATELETS: 240 10*3/uL (ref 150–440)
RBC: 2.47 10*6/uL — ABNORMAL LOW (ref 4.40–5.90)
RDW: 18.4 % — ABNORMAL HIGH (ref 11.5–14.5)
WBC: 9.3 10*3/uL (ref 3.8–10.6)

## 2013-10-23 LAB — COMPREHENSIVE METABOLIC PANEL
ALT: 26 U/L (ref 12–78)
ANION GAP: 6 — AB (ref 7–16)
AST: 25 U/L (ref 15–37)
Albumin: 2 g/dL — ABNORMAL LOW (ref 3.4–5.0)
Alkaline Phosphatase: 170 U/L — ABNORMAL HIGH
BILIRUBIN TOTAL: 0.6 mg/dL (ref 0.2–1.0)
BUN: 26 mg/dL — ABNORMAL HIGH (ref 7–18)
CO2: 24 mmol/L (ref 21–32)
Calcium, Total: 8.6 mg/dL (ref 8.5–10.1)
Chloride: 111 mmol/L — ABNORMAL HIGH (ref 98–107)
Creatinine: 1.76 mg/dL — ABNORMAL HIGH (ref 0.60–1.30)
EGFR (African American): 37 — ABNORMAL LOW
GFR CALC NON AF AMER: 32 — AB
Glucose: 146 mg/dL — ABNORMAL HIGH (ref 65–99)
OSMOLALITY: 289 (ref 275–301)
POTASSIUM: 3.2 mmol/L — AB (ref 3.5–5.1)
SODIUM: 141 mmol/L (ref 136–145)
TOTAL PROTEIN: 5.1 g/dL — AB (ref 6.4–8.2)

## 2013-10-24 LAB — CBC WITH DIFFERENTIAL/PLATELET
BASOS ABS: 0 10*3/uL (ref 0.0–0.1)
Basophil %: 0.1 %
EOS PCT: 0.3 %
Eosinophil #: 0 10*3/uL (ref 0.0–0.7)
HCT: 25.2 % — ABNORMAL LOW (ref 40.0–52.0)
HGB: 8.1 g/dL — ABNORMAL LOW (ref 13.0–18.0)
LYMPHS PCT: 5.4 %
Lymphocyte #: 0.6 10*3/uL — ABNORMAL LOW (ref 1.0–3.6)
MCH: 31 pg (ref 26.0–34.0)
MCHC: 32.2 g/dL (ref 32.0–36.0)
MCV: 96 fL (ref 80–100)
MONO ABS: 0.9 x10 3/mm (ref 0.2–1.0)
Monocyte %: 8.1 %
NEUTROS ABS: 9.6 10*3/uL — AB (ref 1.4–6.5)
NEUTROS PCT: 86.1 %
Platelet: 250 10*3/uL (ref 150–440)
RBC: 2.62 10*6/uL — AB (ref 4.40–5.90)
RDW: 18.2 % — AB (ref 11.5–14.5)
WBC: 11.1 10*3/uL — AB (ref 3.8–10.6)

## 2013-10-24 LAB — BASIC METABOLIC PANEL
Anion Gap: 7 (ref 7–16)
BUN: 26 mg/dL — AB (ref 7–18)
CO2: 25 mmol/L (ref 21–32)
Calcium, Total: 8.7 mg/dL (ref 8.5–10.1)
Chloride: 109 mmol/L — ABNORMAL HIGH (ref 98–107)
Creatinine: 1.45 mg/dL — ABNORMAL HIGH (ref 0.60–1.30)
EGFR (African American): 47 — ABNORMAL LOW
EGFR (Non-African Amer.): 40 — ABNORMAL LOW
GLUCOSE: 117 mg/dL — AB (ref 65–99)
Osmolality: 287 (ref 275–301)
POTASSIUM: 3.4 mmol/L — AB (ref 3.5–5.1)
Sodium: 141 mmol/L (ref 136–145)

## 2013-10-25 LAB — BASIC METABOLIC PANEL
Anion Gap: 6 — ABNORMAL LOW (ref 7–16)
BUN: 23 mg/dL — AB (ref 7–18)
CO2: 27 mmol/L (ref 21–32)
Calcium, Total: 8.5 mg/dL (ref 8.5–10.1)
Chloride: 110 mmol/L — ABNORMAL HIGH (ref 98–107)
Creatinine: 1.23 mg/dL (ref 0.60–1.30)
EGFR (African American): 57 — ABNORMAL LOW
GFR CALC NON AF AMER: 49 — AB
GLUCOSE: 106 mg/dL — AB (ref 65–99)
OSMOLALITY: 289 (ref 275–301)
Potassium: 3.3 mmol/L — ABNORMAL LOW (ref 3.5–5.1)
Sodium: 143 mmol/L (ref 136–145)

## 2013-10-25 LAB — CBC WITH DIFFERENTIAL/PLATELET
BASOS PCT: 0.1 %
Basophil #: 0 10*3/uL (ref 0.0–0.1)
EOS ABS: 0.1 10*3/uL (ref 0.0–0.7)
EOS PCT: 0.7 %
HCT: 23.7 % — AB (ref 40.0–52.0)
HGB: 7.5 g/dL — ABNORMAL LOW (ref 13.0–18.0)
LYMPHS ABS: 0.4 10*3/uL — AB (ref 1.0–3.6)
LYMPHS PCT: 5.5 %
MCH: 30.6 pg (ref 26.0–34.0)
MCHC: 31.7 g/dL — AB (ref 32.0–36.0)
MCV: 97 fL (ref 80–100)
MONO ABS: 0.8 x10 3/mm (ref 0.2–1.0)
MONOS PCT: 9.3 %
NEUTROS ABS: 6.9 10*3/uL — AB (ref 1.4–6.5)
Neutrophil %: 84.4 %
Platelet: 212 10*3/uL (ref 150–440)
RBC: 2.45 10*6/uL — AB (ref 4.40–5.90)
RDW: 17.5 % — AB (ref 11.5–14.5)
WBC: 8.2 10*3/uL (ref 3.8–10.6)

## 2013-10-27 LAB — BASIC METABOLIC PANEL
ANION GAP: 1 — AB (ref 7–16)
BUN: 22 mg/dL — ABNORMAL HIGH (ref 7–18)
CO2: 34 mmol/L — AB (ref 21–32)
Calcium, Total: 8.5 mg/dL (ref 8.5–10.1)
Chloride: 106 mmol/L (ref 98–107)
Creatinine: 1.26 mg/dL (ref 0.60–1.30)
EGFR (Non-African Amer.): 48 — ABNORMAL LOW
GFR CALC AF AMER: 55 — AB
Glucose: 101 mg/dL — ABNORMAL HIGH (ref 65–99)
Osmolality: 285 (ref 275–301)
Potassium: 3.6 mmol/L (ref 3.5–5.1)
SODIUM: 141 mmol/L (ref 136–145)

## 2013-11-17 ENCOUNTER — Ambulatory Visit: Payer: Self-pay | Admitting: Internal Medicine

## 2014-02-23 ENCOUNTER — Ambulatory Visit: Payer: Self-pay | Admitting: Internal Medicine

## 2014-03-31 ENCOUNTER — Emergency Department: Payer: Self-pay | Admitting: Emergency Medicine

## 2014-03-31 LAB — URINALYSIS, COMPLETE
BACTERIA: NONE SEEN
BLOOD: NEGATIVE
Bilirubin,UR: NEGATIVE
GLUCOSE, UR: NEGATIVE mg/dL (ref 0–75)
Ketone: NEGATIVE
Leukocyte Esterase: NEGATIVE
Nitrite: NEGATIVE
Ph: 5 (ref 4.5–8.0)
Protein: 30
RBC,UR: 1 /HPF (ref 0–5)
SQUAMOUS EPITHELIAL: NONE SEEN
Specific Gravity: 1.018 (ref 1.003–1.030)

## 2014-03-31 LAB — BASIC METABOLIC PANEL
Anion Gap: 8 (ref 7–16)
BUN: 25 mg/dL — ABNORMAL HIGH (ref 7–18)
CALCIUM: 8.7 mg/dL (ref 8.5–10.1)
CO2: 27 mmol/L (ref 21–32)
CREATININE: 1.71 mg/dL — AB (ref 0.60–1.30)
Chloride: 104 mmol/L (ref 98–107)
EGFR (African American): 48 — ABNORMAL LOW
GFR CALC NON AF AMER: 40 — AB
Glucose: 124 mg/dL — ABNORMAL HIGH (ref 65–99)
Osmolality: 283 (ref 275–301)
POTASSIUM: 3.4 mmol/L — AB (ref 3.5–5.1)
SODIUM: 139 mmol/L (ref 136–145)

## 2014-03-31 LAB — CBC WITH DIFFERENTIAL/PLATELET
BASOS PCT: 0.1 %
Basophil #: 0 10*3/uL (ref 0.0–0.1)
EOS ABS: 0 10*3/uL (ref 0.0–0.7)
Eosinophil %: 0 %
HCT: 34.2 % — ABNORMAL LOW (ref 40.0–52.0)
HGB: 10.8 g/dL — AB (ref 13.0–18.0)
Lymphocyte #: 0.3 10*3/uL — ABNORMAL LOW (ref 1.0–3.6)
Lymphocyte %: 2.9 %
MCH: 30 pg (ref 26.0–34.0)
MCHC: 31.6 g/dL — AB (ref 32.0–36.0)
MCV: 95 fL (ref 80–100)
MONO ABS: 0.7 x10 3/mm (ref 0.2–1.0)
Monocyte %: 6.1 %
NEUTROS ABS: 11 10*3/uL — AB (ref 1.4–6.5)
Neutrophil %: 90.9 %
PLATELETS: 185 10*3/uL (ref 150–440)
RBC: 3.6 10*6/uL — AB (ref 4.40–5.90)
RDW: 18.7 % — ABNORMAL HIGH (ref 11.5–14.5)
WBC: 12.1 10*3/uL — AB (ref 3.8–10.6)

## 2014-03-31 LAB — HEPATIC FUNCTION PANEL A (ARMC)
ALK PHOS: 214 U/L — AB
ALT: 98 U/L — AB
Albumin: 3 g/dL — ABNORMAL LOW (ref 3.4–5.0)
BILIRUBIN DIRECT: 1 mg/dL — AB (ref 0.0–0.2)
Bilirubin,Total: 1.8 mg/dL — ABNORMAL HIGH (ref 0.2–1.0)
SGOT(AST): 167 U/L — ABNORMAL HIGH (ref 15–37)
Total Protein: 6.7 g/dL (ref 6.4–8.2)

## 2014-03-31 LAB — TROPONIN I: TROPONIN-I: 0.04 ng/mL

## 2014-03-31 LAB — LIPASE, BLOOD: Lipase: 313 U/L (ref 73–393)

## 2014-04-16 ENCOUNTER — Inpatient Hospital Stay: Payer: Self-pay | Admitting: Internal Medicine

## 2014-04-16 LAB — COMPREHENSIVE METABOLIC PANEL
ALBUMIN: 2.7 g/dL — AB (ref 3.4–5.0)
ALK PHOS: 209 U/L — AB
Anion Gap: 7 (ref 7–16)
BILIRUBIN TOTAL: 2.2 mg/dL — AB (ref 0.2–1.0)
BUN: 20 mg/dL — ABNORMAL HIGH (ref 7–18)
CALCIUM: 8.8 mg/dL (ref 8.5–10.1)
CHLORIDE: 102 mmol/L (ref 98–107)
CO2: 29 mmol/L (ref 21–32)
CREATININE: 1.53 mg/dL — AB (ref 0.60–1.30)
GFR CALC AF AMER: 55 — AB
GFR CALC NON AF AMER: 45 — AB
Glucose: 112 mg/dL — ABNORMAL HIGH (ref 65–99)
Osmolality: 279 (ref 275–301)
Potassium: 3.5 mmol/L (ref 3.5–5.1)
SGOT(AST): 143 U/L — ABNORMAL HIGH (ref 15–37)
SGPT (ALT): 65 U/L — ABNORMAL HIGH
SODIUM: 138 mmol/L (ref 136–145)
TOTAL PROTEIN: 6.3 g/dL — AB (ref 6.4–8.2)

## 2014-04-16 LAB — CBC
HCT: 34.6 % — AB (ref 40.0–52.0)
HGB: 11.1 g/dL — AB (ref 13.0–18.0)
MCH: 31.3 pg (ref 26.0–34.0)
MCHC: 32.2 g/dL (ref 32.0–36.0)
MCV: 97 fL (ref 80–100)
Platelet: 167 10*3/uL (ref 150–440)
RBC: 3.56 10*6/uL — ABNORMAL LOW (ref 4.40–5.90)
RDW: 18.1 % — ABNORMAL HIGH (ref 11.5–14.5)
WBC: 7.9 10*3/uL (ref 3.8–10.6)

## 2014-04-16 LAB — URINALYSIS, COMPLETE
BILIRUBIN, UR: NEGATIVE
Bacteria: NONE SEEN
Blood: NEGATIVE
Glucose,UR: NEGATIVE mg/dL (ref 0–75)
Ketone: NEGATIVE
Leukocyte Esterase: NEGATIVE
Nitrite: NEGATIVE
Ph: 5 (ref 4.5–8.0)
Protein: NEGATIVE
RBC,UR: NONE SEEN /HPF (ref 0–5)
SPECIFIC GRAVITY: 1.011 (ref 1.003–1.030)
Squamous Epithelial: <1
WBC UR: NONE SEEN /HPF (ref 0–5)

## 2014-04-16 LAB — LIPASE, BLOOD: Lipase: 235 U/L (ref 73–393)

## 2014-04-16 LAB — TROPONIN I: TROPONIN-I: 0.03 ng/mL

## 2014-04-17 LAB — COMPREHENSIVE METABOLIC PANEL
ALBUMIN: 2.3 g/dL — AB (ref 3.4–5.0)
Alkaline Phosphatase: 157 U/L — ABNORMAL HIGH
Anion Gap: 6 — ABNORMAL LOW (ref 7–16)
BUN: 20 mg/dL — ABNORMAL HIGH (ref 7–18)
Bilirubin,Total: 1.6 mg/dL — ABNORMAL HIGH (ref 0.2–1.0)
CO2: 29 mmol/L (ref 21–32)
CREATININE: 1.58 mg/dL — AB (ref 0.60–1.30)
Calcium, Total: 8.4 mg/dL — ABNORMAL LOW (ref 8.5–10.1)
Chloride: 102 mmol/L (ref 98–107)
EGFR (African American): 53 — ABNORMAL LOW
EGFR (Non-African Amer.): 43 — ABNORMAL LOW
GLUCOSE: 118 mg/dL — AB (ref 65–99)
OSMOLALITY: 278 (ref 275–301)
Potassium: 3.7 mmol/L (ref 3.5–5.1)
SGOT(AST): 81 U/L — ABNORMAL HIGH (ref 15–37)
SGPT (ALT): 59 U/L
Sodium: 137 mmol/L (ref 136–145)
Total Protein: 5.5 g/dL — ABNORMAL LOW (ref 6.4–8.2)

## 2014-04-17 LAB — CBC WITH DIFFERENTIAL/PLATELET
BASOS ABS: 0 10*3/uL (ref 0.0–0.1)
Basophil %: 0.1 %
Eosinophil #: 0 10*3/uL (ref 0.0–0.7)
Eosinophil %: 0 %
HCT: 30.2 % — ABNORMAL LOW (ref 40.0–52.0)
HGB: 9.7 g/dL — ABNORMAL LOW (ref 13.0–18.0)
Lymphocyte #: 0.6 10*3/uL — ABNORMAL LOW (ref 1.0–3.6)
Lymphocyte %: 8.7 %
MCH: 31.3 pg (ref 26.0–34.0)
MCHC: 32.2 g/dL (ref 32.0–36.0)
MCV: 97 fL (ref 80–100)
MONOS PCT: 8.5 %
Monocyte #: 0.6 x10 3/mm (ref 0.2–1.0)
NEUTROS PCT: 82.7 %
Neutrophil #: 6.2 10*3/uL (ref 1.4–6.5)
PLATELETS: 133 10*3/uL — AB (ref 150–440)
RBC: 3.1 10*6/uL — ABNORMAL LOW (ref 4.40–5.90)
RDW: 18.2 % — ABNORMAL HIGH (ref 11.5–14.5)
WBC: 7.5 10*3/uL (ref 3.8–10.6)

## 2014-04-17 LAB — CLOSTRIDIUM DIFFICILE(ARMC)

## 2014-04-17 LAB — PROTIME-INR
INR: 1.2
Prothrombin Time: 15.1 secs — ABNORMAL HIGH (ref 11.5–14.7)

## 2014-04-17 IMAGING — CR DG CHEST 1V PORT
1 series · 1 of 1 positions shown · non-contrast
Comparison: none

REASON FOR EXAM: Chest Pain
COMMENTS:

[x chest ap]
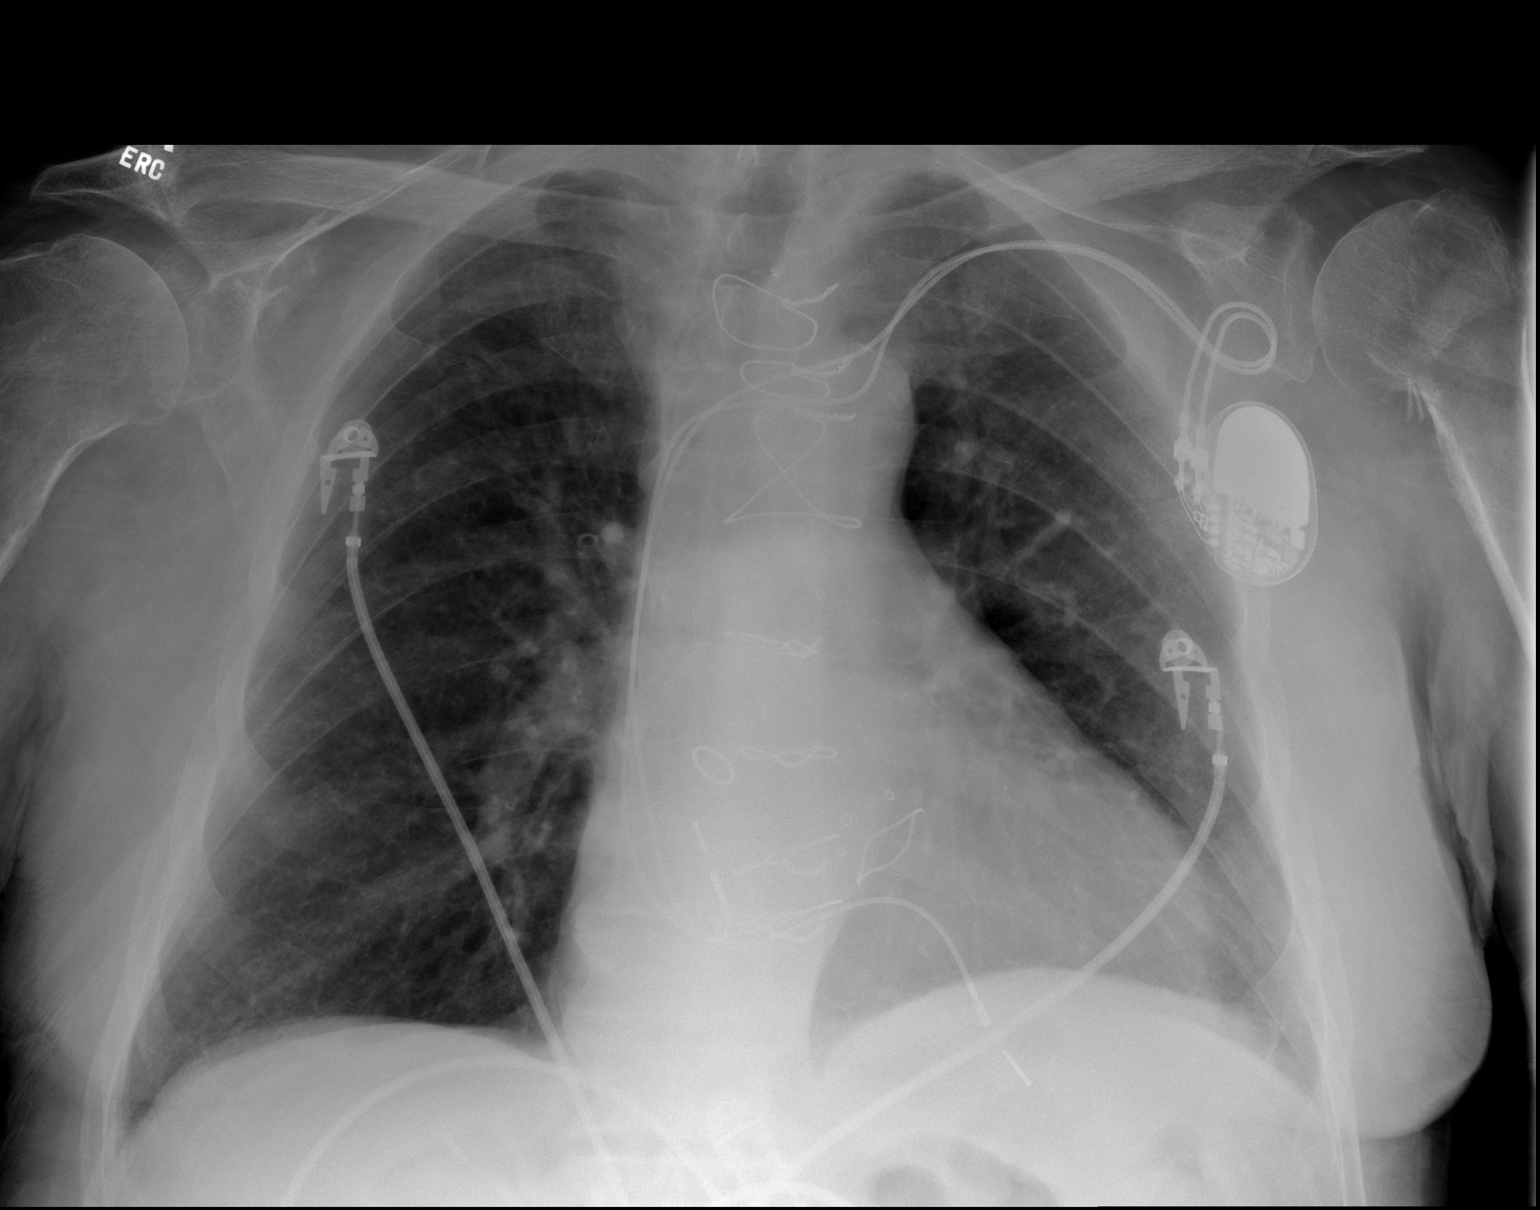

[1 of 1 positions shown; findings below may reference images not displayed]

PROCEDURE:     DXR - DXR PORTABLE CHEST SINGLE VIEW  - May 02, 2012  [DATE]

RESULT:     Comparison is made to the previous exam dated 13 March, 2012.
Left-sided pacemaker device is present. Cardiac monitoring electrodes
present. Sternotomy wires are in place. There is no infiltrate, edema,
effusion or pneumothorax. There is improved visualization of the left lung
base compared to the previous exam.
IMPRESSION: 1. No acute cardiopulmonary disease. Pacemaker present.

[REDACTED]

## 2014-04-18 LAB — CBC WITH DIFFERENTIAL/PLATELET
BASOS ABS: 0 10*3/uL (ref 0.0–0.1)
Basophil %: 0.2 %
EOS PCT: 0.9 %
Eosinophil #: 0 10*3/uL (ref 0.0–0.7)
HCT: 31.3 % — ABNORMAL LOW (ref 40.0–52.0)
HGB: 10.2 g/dL — ABNORMAL LOW (ref 13.0–18.0)
LYMPHS ABS: 0.7 10*3/uL — AB (ref 1.0–3.6)
Lymphocyte %: 13.4 %
MCH: 31.3 pg (ref 26.0–34.0)
MCHC: 32.6 g/dL (ref 32.0–36.0)
MCV: 96 fL (ref 80–100)
Monocyte #: 0.5 x10 3/mm (ref 0.2–1.0)
Monocyte %: 10.3 %
Neutrophil #: 4 10*3/uL (ref 1.4–6.5)
Neutrophil %: 75.2 %
Platelet: 141 10*3/uL — ABNORMAL LOW (ref 150–440)
RBC: 3.26 10*6/uL — ABNORMAL LOW (ref 4.40–5.90)
RDW: 18.6 % — AB (ref 11.5–14.5)
WBC: 5.3 10*3/uL (ref 3.8–10.6)

## 2014-04-18 LAB — COMPREHENSIVE METABOLIC PANEL
ALBUMIN: 2.2 g/dL — AB (ref 3.4–5.0)
ANION GAP: 8 (ref 7–16)
Alkaline Phosphatase: 138 U/L — ABNORMAL HIGH
BUN: 18 mg/dL (ref 7–18)
Bilirubin,Total: 0.8 mg/dL (ref 0.2–1.0)
CHLORIDE: 107 mmol/L (ref 98–107)
Calcium, Total: 8.2 mg/dL — ABNORMAL LOW (ref 8.5–10.1)
Co2: 25 mmol/L (ref 21–32)
Creatinine: 1.56 mg/dL — ABNORMAL HIGH (ref 0.60–1.30)
EGFR (Non-African Amer.): 44 — ABNORMAL LOW
GFR CALC AF AMER: 53 — AB
GLUCOSE: 107 mg/dL — AB (ref 65–99)
Osmolality: 282 (ref 275–301)
POTASSIUM: 3.9 mmol/L (ref 3.5–5.1)
SGOT(AST): 42 U/L — ABNORMAL HIGH (ref 15–37)
SGPT (ALT): 41 U/L
Sodium: 140 mmol/L (ref 136–145)
Total Protein: 5.5 g/dL — ABNORMAL LOW (ref 6.4–8.2)

## 2014-04-18 IMAGING — CR RIGHT HIP - COMPLETE 2+ VIEW
1 series · 2 of 2 positions shown · non-contrast
Comparison: none

REASON FOR EXAM: recent fall with report of R hip fracture - reasses (hx
of old hip replacement a
COMMENTS:

PROCEDURE:     DXR - DXR HIP RIGHT COMPLETE  - May 03, 2012  [DATE]
RESULT:     Right hip arthroplasty changes are present. There is a fracture
through the intratrochanteric region with distraction. This appears to
represent an acute fracture. Orthopedic surgical consultation is recommended.

[Series 5: x hip ap right · 0.14mm/px · 2 of 2 slices shown]
[im 1/2]
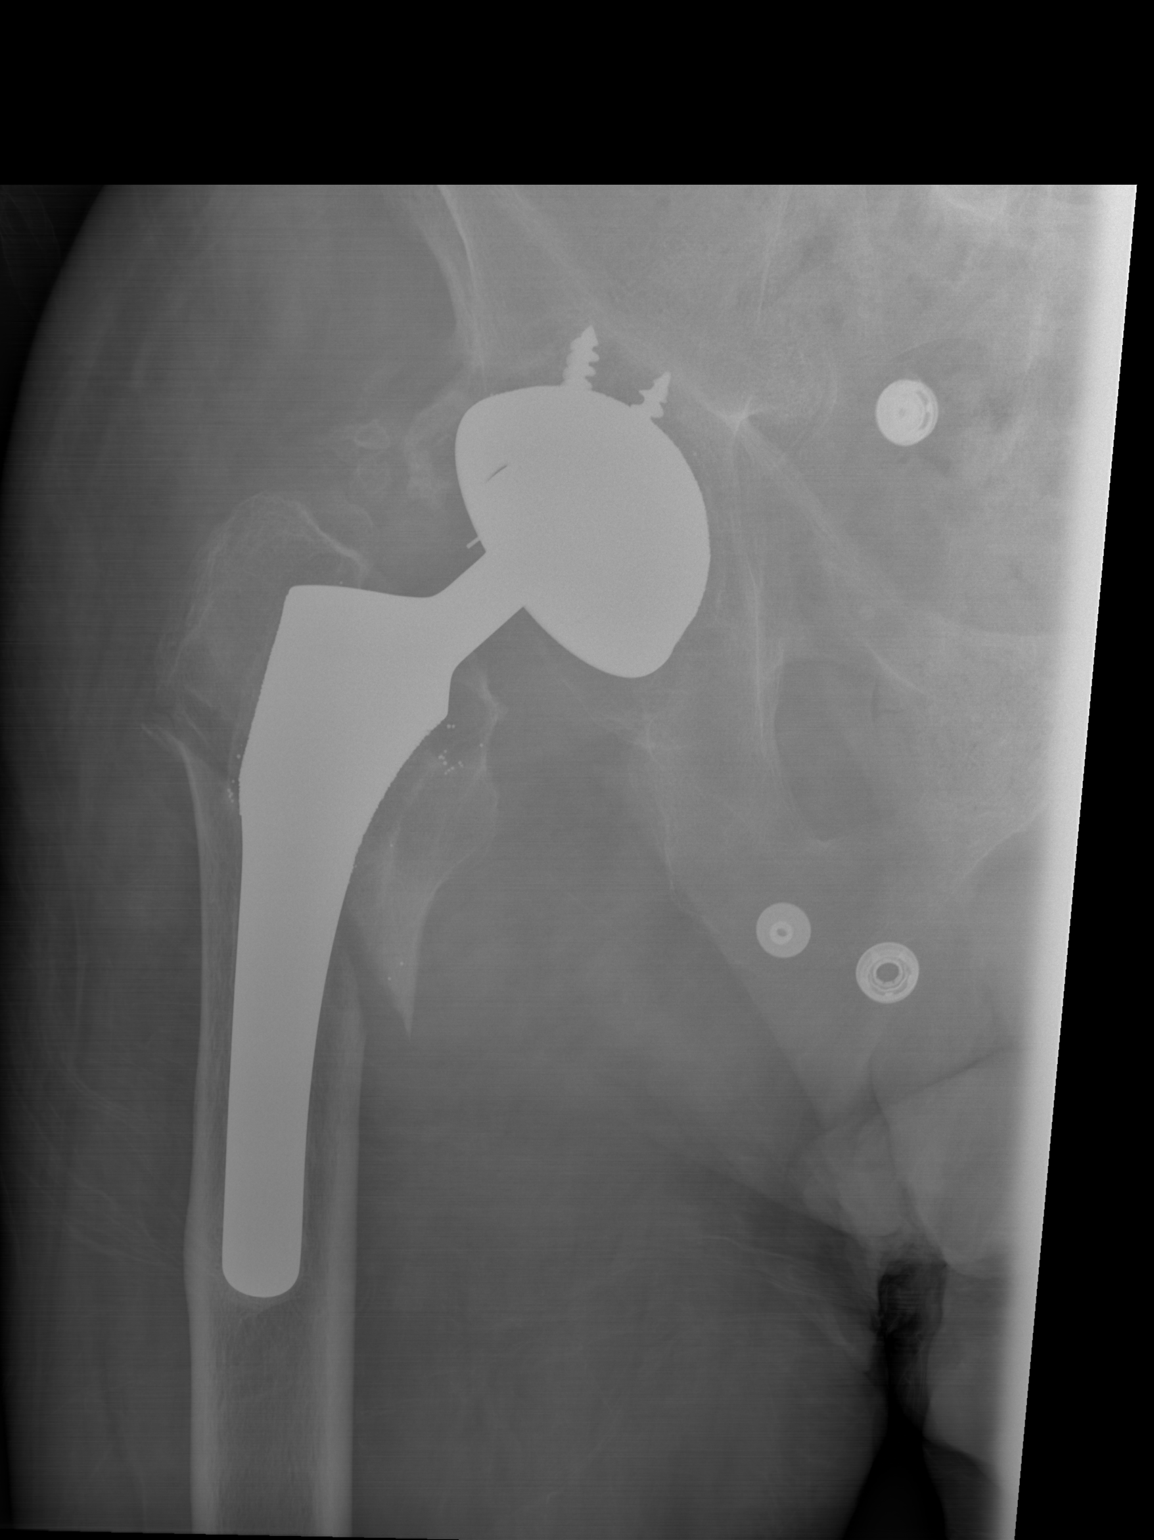
[im 2/2]
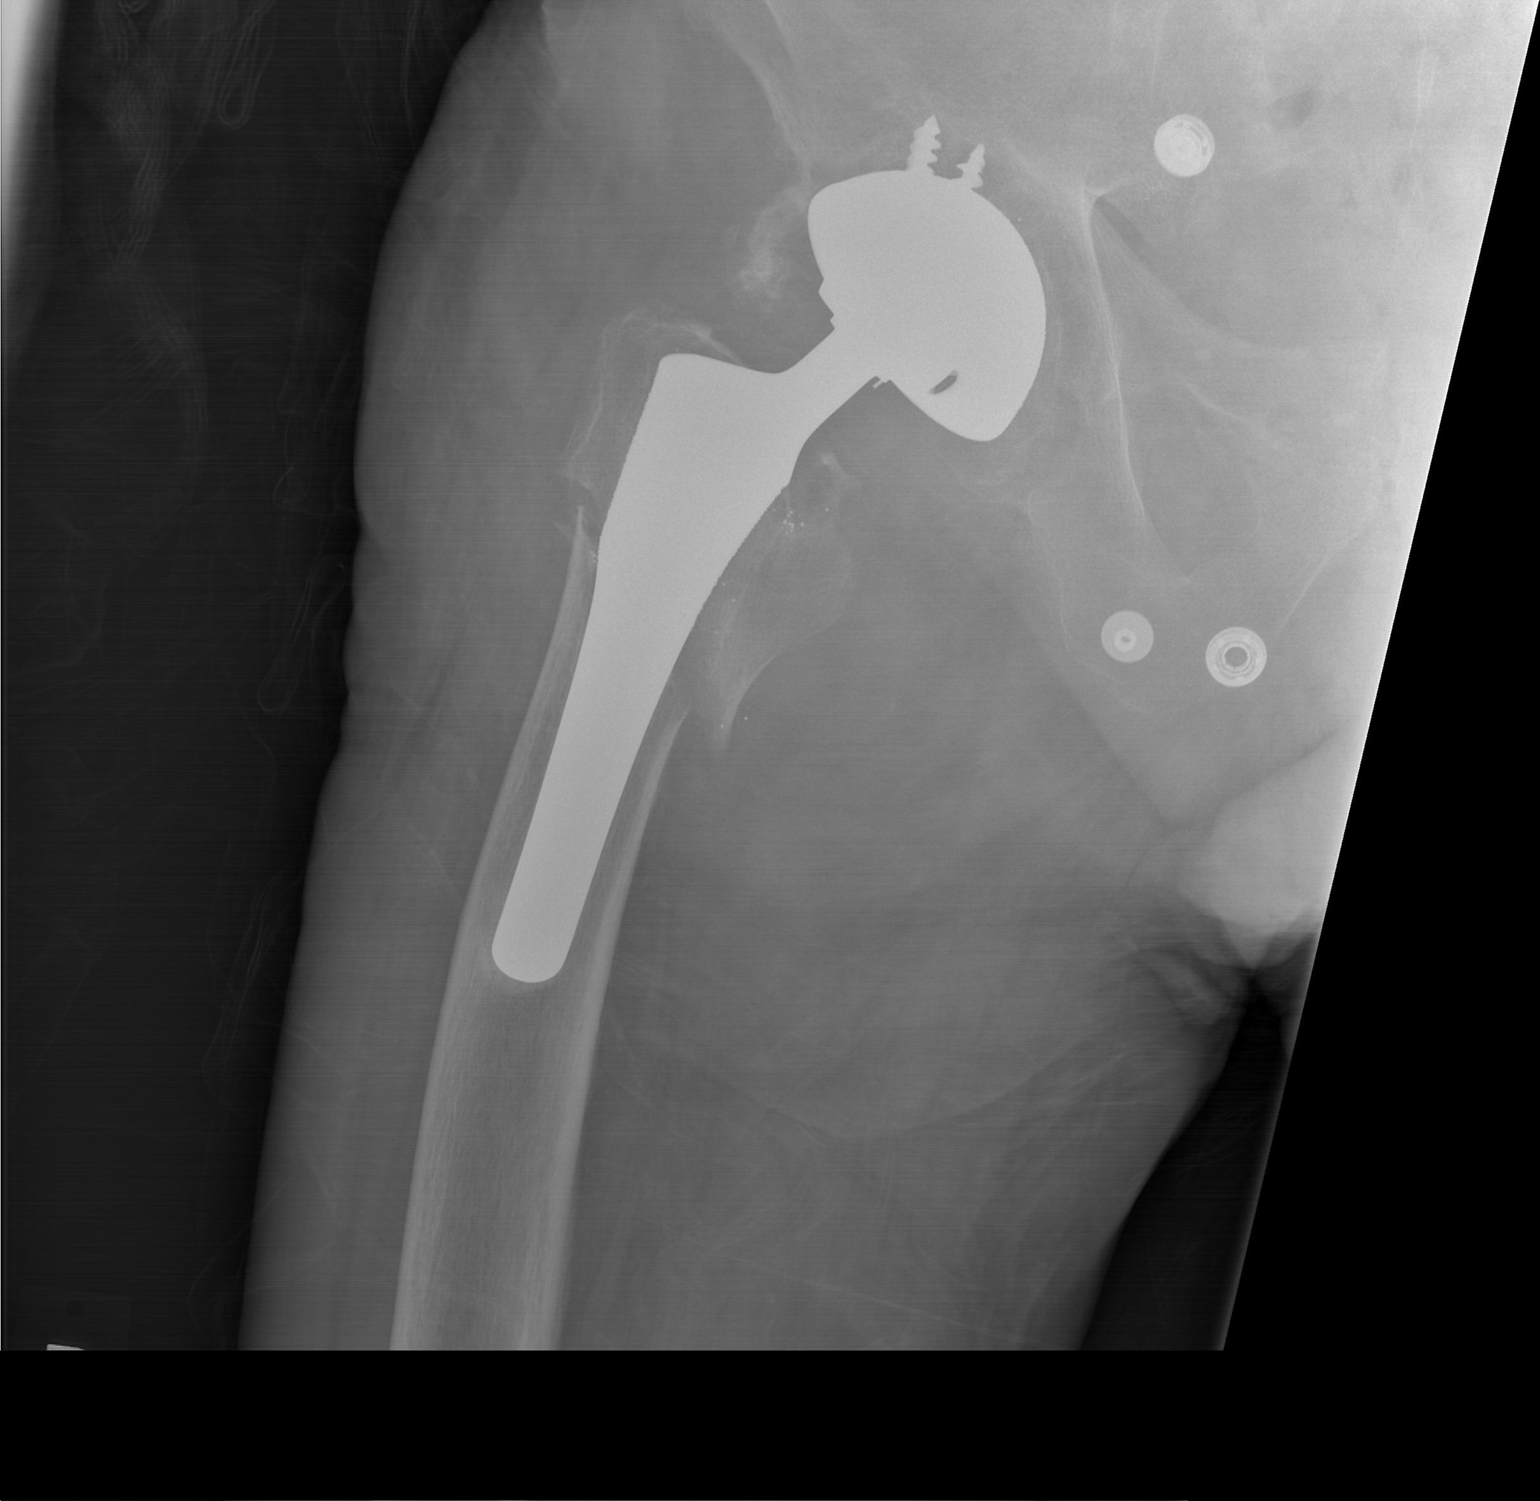

[2 of 2 positions shown; findings below may reference images not displayed]

IMPRESSION: New intratrochanteric proximal right femoral fracture with
arthroplasty hardware present.

[REDACTED]

## 2014-04-18 IMAGING — CR PELVIS - 1-2 VIEW
1 series · 2 of 2 positions shown · non-contrast
Comparison: none

REASON FOR EXAM: recent fall with report of R hip fracture - reasses (hx
of old hip replacement a
COMMENTS:

PROCEDURE:     DXR - DXR PELVIS AP ONLY  - May 03, 2012  [DATE]
RESULT:     Bilateral hip arthroplasty hardware is present. There is an
oblique fracture in the intratrochanteric region of the right femur. No
additional pelvic fractures appreciated. Orthopedic consultation is
recommended.

[Series 5: x pelvis · 0.14mm/px · 2 of 2 slices shown]
[im 1/2]
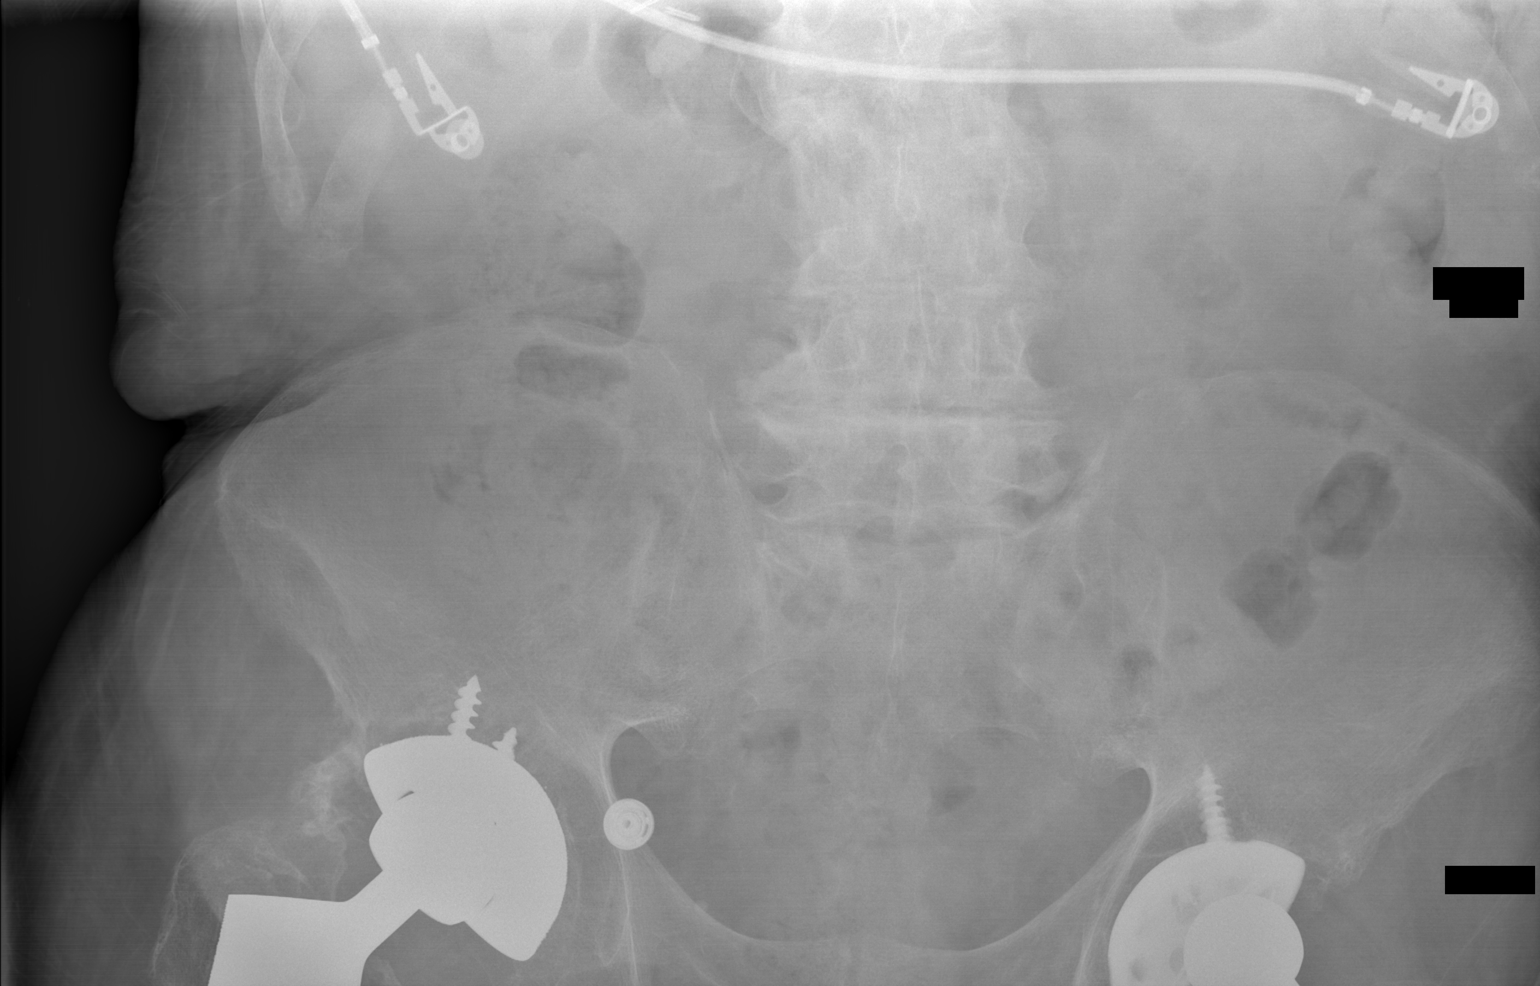
[im 2/2]
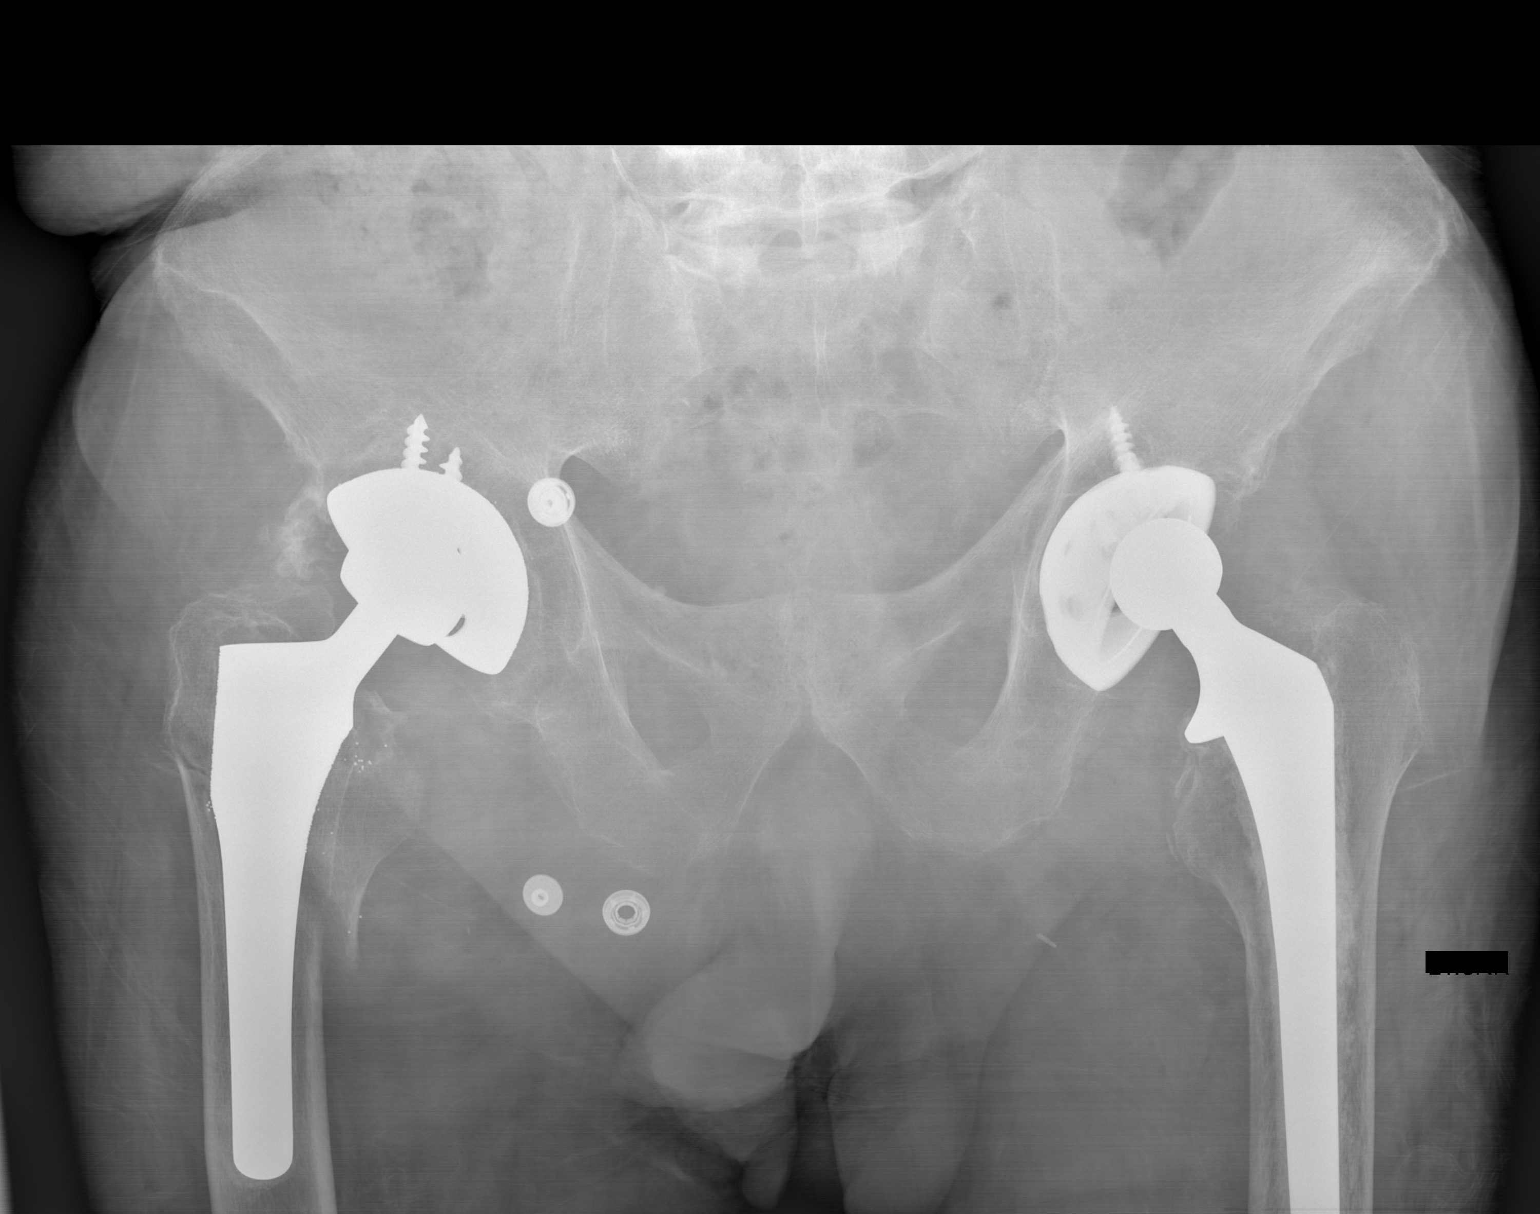

[2 of 2 positions shown; findings below may reference images not displayed]

IMPRESSION: Please see above.

[REDACTED]

## 2014-04-19 LAB — HEPATIC FUNCTION PANEL A (ARMC)
ALK PHOS: 139 U/L — AB
Albumin: 2.3 g/dL — ABNORMAL LOW (ref 3.4–5.0)
Bilirubin, Direct: 0.3 mg/dL — ABNORMAL HIGH (ref 0.0–0.2)
Bilirubin,Total: 0.7 mg/dL (ref 0.2–1.0)
SGOT(AST): 24 U/L (ref 15–37)
SGPT (ALT): 31 U/L
Total Protein: 5.9 g/dL — ABNORMAL LOW (ref 6.4–8.2)

## 2014-04-19 LAB — LIPASE, BLOOD: LIPASE: 291 U/L (ref 73–393)

## 2014-04-21 LAB — CULTURE, BLOOD (SINGLE)

## 2014-09-06 NOTE — Consult Note (Signed)
Orthopaedic Surgery Consult Note     Subjective/Chief Complaint left shoulder pain    History of Present Illness 81yo WM community ambulator who has a significant PMH for bilateral THA by Dr. Gavin PottersKernodle and a recent known history of right femur periprosthetic fracture (Known since 04/24/2012) that was being treated nonoperatively per patient and family wishes.  he fell today and now has left shoulder pain and eccymosis. He currently lives with his wife and up until his right periprosthetic hip fracture was able to drive and manage daily activities independently.    Past History R hip periprosthetic fracture 04/24/2012   Past Med/Surgical Hx:  valvular cardiomyopathy with a valve replacement:   aortic stenosis:   degenerative arthritis:   complete heart block with dual chamber pacemaker:   bradycardia:   gastritis:   HTN:   Pacemaker:   TURP - Transurethral Resection of Prostate:   Hip Replacement - Right:   Hip Replacement - Left:   ALLERGIES:  No Known Allergies:   HOME MEDICATIONS: Medication Instructions Status  carvedilol 25 mg oral tablet 1 tab(s) orally 2 times a day Active  omeprazole 20 mg oral delayed release capsule 1 cap(s) orally 2 times a day  Active  simvastatin 20 mg oral tablet 1 tab(s) orally once a day Active  ramipril 5 mg oral capsule 1 cap(s) orally once a day Active  Nitrostat 0.4 mg sublingual tablet 1 tab(s) sublingual every 5 minutes for 3 doses for chest pain Active  bc powder 1 packet(s) orally once a day Active  furosemide 40 mg oral tablet 1 tab(s) orally once a day Active  acetaminophen-oxycodone 325 mg-5 mg oral tablet 1 tab(s) orally every 6 hours Active   Family and Social History:   Family History Non-Contributory    Social History negative tobacco, negative ETOH, negative Illicit drugs, never smoked, never drank    Place of Living Home   Physical Exam:   Additional Comments PHYSICAL EXAM:- No acute distress, alert and oriented-  normocephalic, atraumatic. Normal external ears and nose without gross deformity. Hearing grossly intact. Non-dysplastic facies. - capillary refill < 2 sec distally- nonlabored respirations Nml, symmetric, skin intact, no rashes, ulcers or lesionsNml skeletal alignment no deformityNo tenderness, masses, crepitance or effusion, compartments softNml range of motion in all joints no pain, subluxation or excessive laxity with acitve or passive ROMIntact MED/RAD/ULN n., Nml sensation to LT throughoutIntact AIN/PIN//ULN n., strength 4-5/5 throughout Warm, pink, perfused. 2+ rad pulse. Capillary refill <2 secondsepitrochlear Lymphadenopathy Skin intact. eccymosis over shoulderTTP over deformity, compartments softLimited due to painIntact MED/RAD/ULN n., Nml sensation to LT throughoutIntact AIN/PIN//ULN n., decreased due to painWarm, pink, perfused. 2+ rad pulse. Capillary refill <2 secondsepitrochlear Lymphadenopathy Skin intact. resolving eccymosistender to palpation over right greater trochanter, compartments softLimited due to painIntact DP/SP/Sural/Tib., Nml sensation to LT throughoutIntact extensor hallicus longus/flexor hallicus longus/gastroc soleous complex/tibialis anterior, decreased strength due to painWarm, pink, perfused. 2+ DP pulse. Capillary refill <2 seconds  Nml, symmetric, skin intact, no rashes, ulcers or lesionsNml skeletal alignment no deformityNo tenderness, masses, crepitance or effusion, compartments softNml range of motion in all joints no pain, subluxation or excessive laxity with acitve or passive ROMDP/SP/Sural n., Nml sensation to LT throughoutIntact Intact extensor hallicus longus/flexor hallicus longus/gastroc soleous complex/tibialis anterior, strength 4-5/5 throughout Warm, pink, perfused. 2+ DP pulse. Capillary refill <2 seconds  shoulder: proximal humerus fracture, shoulder located hip: [place] B1, intertrochanteric periprosthetic fracture     Assessment/Admission Diagnosis  1.Left proximal humerus fracture - acuteperiprosthetic hip fracture - subacute  Plan Left proximal humerus - discussed risks, benefits, and alternatives to management. plan for nonoperative managementNWB left upper extremitySlingFollow up 1-2 wks with Gavin Potters clinicAdmit per medicine  periprosthetic hip fracture - plan per [facility], [facility], follow up as scheduled    Electronic Signatures: Darl Householder (MD)  (Signed on 15-Dec-13 02:36)  Authored  Last Updated: 15-Dec-13 02:36 by Darl Householder (MD)

## 2014-09-06 NOTE — H&P (Signed)
PATIENT NAME:  [name] ChildsCLAYTON, Garry E MR#:  [id number] DATE OF BIRTH:  Oct 29, 1915  DATE OF ADMISSION:  05/03/2012  PRIMARY CARE PHYSICIAN: Clydie Braunavid Fitzgerald, MD  REFERRING PHYSICIAN: Janalyn Harderavid Kaminski, MD  CHIEF COMPLAINT: Status post fall with left proximal humerus head fracture.   HISTORY OF PRESENT ILLNESS: This is a 81 year old male with significant past medical history of coronary artery disease, arthritis, diverticulosis and bilateral hip replacement who presents with complaints of falls. The patient has history of recent fall, where he sustained right periprosthetic femur fracture, where he was using a walker since then. The patient had a fall this evening. The patient reports he was using the walker walking at his home, where he fell to the left side. He denies any dizziness, lightheadedness, or loss of consciousness. He reports it was a mechanical fall. He sustained a bruise and pain in his left shoulder. In the ED, the patient had left shoulder x-ray done which did show left proximal humerus head fracture. The patient denies any chest pain, any shortness of breath, any palpitation, lightheadedness, dizziness, coffee-ground emesis, abdominal pain, dysuria, fever or chills, and given the fact the patient cannot ambulate at this point secondary to his femur fracture, and at this point he cannot use the walker secondary to his humerus fracture, hospitalist service was requested to admit the patient for further management of his fracture and possible need for placement. Orthopedic service on call saw and evaluated the patient and reported this is nonoperative humerus fracture and continue with the sling and PT/OT and pain management.  PAST MEDICAL HISTORY:  1. Hypertensive cardiovascular disease.  2. History of arthritis.  3. Gastritis.  4. Moderate aortic stenosis.  5. Benign prostatic hypertrophy status post TURP.  6. Complete heart block with dual-chamber pacemaker.  7. Valvular cardiopathy with valve  replacement.  8. Diverticulosis with significant GI bleed in February 2010.  9. Glucose intolerance.  10. Renal insufficiency.   PAST SURGICAL HISTORY: 1. CABG x 2, followed by Dr. Darrold JunkerParaschos. 2. Previous aortic valve replacement.  3. Dual-chamber pacemaker.  4. Hip replacement x 2. 5. TURP in 1983.  MEDICATIONS: 1. Coreg 25 mg p.o. twice a day. 2. Omeprazole 20 mg twice a day. 3. Ramipril 5 mg daily.  4. Simvastatin 20 mg daily.  5. Citalopram 20 mg daily.  6. Lasix 40 mg daily.   ALLERGIES: No known drug allergies.   FAMILY HISTORY: Nonsignificant for cardiovascular disease. Both parents died of old age, in their 2380s and 4090s.   SOCIAL HISTORY: The patient is a nonsmoker. No alcohol abuse. He lives with his wife at home. His son recently moved in to help take care of his parents.   REVIEW OF SYSTEMS: CONSTITUTIONAL: The patient denies any fever, chills, fatigue or weakness. EYES: Denies blurry vision, double vision or pain. ENT: Denies tinnitus, ear pain, hearing loss or epistaxis. RESPIRATORY: Denies cough, hemoptysis, dyspnea or chronic obstructive pulmonary disease. CARDIOVASCULAR: Denies chest pain, edema, arrhythmia, palpitations or syncope. GASTROINTESTINAL: Denies nausea, vomiting, diarrhea, abdominal pain or hematemesis. GU: Denies dysuria, hematuria or renal colic. ENDOCRINE: Denies polyuria, polydipsia or heat or cold intolerance. HEMATOLOGY: Denies easy bruising, bleeding diathesis or history of blood clots. INTEGUMENTARY: Denies acne, rash or skin lesions. MUSCULOSKELETAL: Complains of right hip pain from recent hip fracture. Has history of arthritis. Denies any cramps or gout. Has limited activity secondary to his recent hip fracture. NEUROLOGIC: Denies ataxia, dementia, headache, tremors or vertigo. PSYCHIATRIC: Denies anxiety, schizophrenia, nervousness or insomnia.   PHYSICAL EXAMINATION:  VITAL SIGNS: Temperature 97.6, pulse 59, respiratory rate 18, blood pressure  181/85 and saturating 97% on room air.   GENERAL: Frail, elderly male who looks comfortable in bed, in no apparent distress.   HEENT: Head is atraumatic, normocephalic. Pupils are equal and reactive to light. Pink conjunctivae.  Anicteric sclerae.  Moist oral mucosa.   NECK: Supple. No thyromegaly. No JVD.   CHEST: Good air entry bilaterally. No wheezing, rales or rhonchi.   CARDIOVASCULAR: S1 and S2 heard. No rubs, murmurs or gallops.   ABDOMEN: Soft, nontender and nondistended. Bowel sounds present.   EXTREMITIES: No edema. No clubbing. No cyanosis. Has right lower extremity shorter than the left and rotated to the right.   MUSCULOSKELETAL: Has left upper extremity bruise in the upper humerus area with tenderness.   PSYCHIATRIC: Appropriate affect, awake and alert x 3.  Intact judgment and insight.   NEUROLOGIC: Cranial nerves grossly intact. Motor 5 out of 5 in all extremities, but movement is limited in the right lower extremity and left upper extremity due to pain.   SKIN: Warm, dry. Normal skin turgor.   PERTINENT LABS: Glucose 103, BUN 16, creatinine 1.31, sodium 138, potassium 3.8, chloride 101 and CO2 30. Troponin 0.03. White blood cells 6.7, hemoglobin 11.8, hematocrit 34.2 and platelets 300.   ASSESSMENT AND PLAN: This is a 81 year old male who presents status post mechanical fall who currently has left proximal humerus fracture, nonsurgical fracture, with recent right periprosthetic femur fracture due to fall.  1. Gait disturbance. The patient is status post a fall due to unsteady gait and recent femur fracture. At this point, secondary to the patient's humerus fracture and femur fracture, he is unable to walk as well as unable to use a walker, so will consult PT and OT services. As well we will consult [facility] in the a.m. to follow with the patient, but most likely the patient will need placement and very likely will be wheelchair dependent.   2. Hypertension, uncontrolled. We will continue the patient on his home medication. As well we will add p.r.n. hydralazine as needed.  3. History of gastritis. Continue with PPI.  4. History of bradycardia and complete heart block. The patient currently has a dual chamber pacemaker and his EKG is showing paced rhythm.  5. Hyperlipidemia. Continue with statin.  6. Deep vein thrombosis prophylaxis. Subcutaneous heparin.  7. Gastritis. Continue with PPI.   CODE STATUS: The patient is FULL CODE.   TOTAL TIME SPENT ON ADMISSION AND PATIENT CARE: 50 minutes.  ____________________________ Starleen Arms, MD dse:sb D: 05/03/2012 02:46:01 ET T: 05/03/2012 11:53:38 ET JOB#: [id number]  cc: Starleen Arms, MD, <Dictator> Brae Schaafsma Teena Irani MD ELECTRONICALLY SIGNED 05/04/2012 4:24

## 2014-09-06 NOTE — H&P (Signed)
  Past Med/Surgical Hx:  valvular cardiomyopathy with a valve replacement:   aortic stenosis:   degenerative arthritis:   complete heart block with dual chamber pacemaker:   bradycardia:   gastritis:   HTN:   Pacemaker:   TURP - Transurethral Resection of Prostate:   Hip Replacement - Right:   Hip Replacement - Left:   ALLERGIES:  No Known Allergies:   HOME MEDICATIONS: Medication Instructions Status  carvedilol 25 mg oral tablet 1 tab(s) orally 2 times a day Active  omeprazole 20 mg oral delayed release capsule 1 cap(s) orally 2 times a day  Active  simvastatin 20 mg oral tablet 1 tab(s) orally once a day Active  ramipril 5 mg oral capsule 1 cap(s) orally once a day Active  Nitrostat 0.4 mg sublingual tablet 1 tab(s) sublingual every 5 minutes for 3 doses for chest pain Active  bc powder 1 packet(s) orally once a day Active  furosemide 40 mg oral tablet 1 tab(s) orally once a day Active  acetaminophen-oxycodone 325 mg-5 mg oral tablet 1 tab(s) orally every 6 hours Active            Electronic Signatures: Darl HouseholderStabile, Tanaisha Pittman J (MD)  (Signed 15-Dec-13 02:32)  Authored: CHIEF COMPLAINT and HISTORY, PAST MEDICAL/SURGIAL HISTORY, ALLERGIES, HOME MEDICATIONS, FAMILY AND SOCIAL HISTORY, PHYSICAL EXAM, Radiology, ASSESSMENT AND PLAN   Last Updated: 15-Dec-13 02:32 by Darl HouseholderStabile, Quint Chestnut J (MD)

## 2014-09-06 NOTE — H&P (Signed)
PATIENT NAME:  [name] Snyder, [name] Snyder MR#:  [id number] DATE OF BIRTH:  09-23-1915  DATE OF ADMISSION:  03/13/2012  ER REFERRING PHYSICIAN: Janalyn Harderavid Kaminski, MD    PRIMARY CARE PHYSICIAN: Stann MainlandDavid P. Sampson GoonFitzgerald, MD   CHIEF COMPLAINT: Chest pain.   HISTORY OF PRESENT ILLNESS: A 81 year old male with a history ischemic wall cardiomyopathy, moderately severe, and history of hypertension, dementia, hyperlipidemia and GERD, came in because of chest pain that started this morning. The patient complains of left-sided chest pain which started and around 6 out of 10 in severity. It is a stabbing pain, no radiation to the hand or back or the neck. The patient denies any trouble breathing or sweating and nausea with orthostatic chest pain, no palpitation, no syncope. The patient complains of mid epigastric pain and has history of constipation. The patient's troponins were negative. EKG: No acute signs. Because of history of his heart disease, I was asked to admit for observation. The patient denies any chest pain at this time, but his blood pressure when he came was elevated at 199/99, pulse 61. The patient did receive aspirin in the ER but no other blood pressure medications were given. The patient is chest pain free during my visit. His blood pressure is still high at 183/88, pulse 65. The patient is symptomatic.    PAST MEDICAL HISTORY:  1. He returned in January for constipation and abdominal pain and chest pain. The patient also had some renal failure at that time and received fluids and MiraLax. The patient also had an echocardiogram which showed an ejection fraction of 35 to 45% in January.  2. The patient also had some moderate hypokinesia and apical wall hypokinesia and global hypokinesia.  3. The patient also had mild aortic stenosis.  4. Other history significant for hypertension, dementia, hyperlipidemia, and gastroesophageal reflux disease.   ALLERGIES: No known allergies.   SOCIAL HISTORY: He lives with his  wife.  No smoking, no drinking.    PAST SURGICAL HISTORY:  1. Significant for coronary artery bypass graft x2 with pig valve in the aortic area.  2. Pacemaker.  3. Right hip surgery.  4. Transurethral resection of the prostate.   FAMILY HISTORY: Father died of old at age 79. Mother also died at age of 79 with old age.   MEDICATIONS:  1. Coreg 25 mg p.o. b.i.d.  2. Celexa 20 mg daily. 3. HCTZ 25 mg p.o. daily.  4. Omeprazole 20 mg daily.  5. Ramipril 5 mg daily. 6. Simvastatin 20 mg daily.   REVIEW OF SYSTEMS: CONSTITUTIONAL: No fever. No fatigue. EYES: No blurred vision. ENT: No tinnitus. No ear pain. No epistaxis. No difficulty swallowing. RESPIRATORY: No cough. No wheezing. CARDIOVASCULAR: Chest pain this morning but no orthopnea. No pedal edema. No palpitations. GI: Complains of epigastric pain and constipation but no nausea or change in bowel habits. GENITOURINARY: No dysuria. ENDOCRINE: No polydipsia or nocturia. INTEGUMENTARY: No skin rashes. MUSCULOSKELETAL: Sometimes feels stiff in his legs in early morning. NEUROLOGICAL: No numbness or weakness. No dysarthria. PSYCHIATRIC: No anxiety or insomnia.   PHYSICAL EXAMINATION:  VITAL SIGNS: The patient's blood pressure initially was 199/99, pulse 61, temperature 98, respirations 22. The patient's O2 saturations were 92% on room air.   GENERAL: She is awake, alert and oriented.   HEENT: Head atraumatic and normocephalic. Pupils are equally reacting to light. Extraocular movements intact. ENT: The patient has no tympanic membrane congestion. Hearing is slightly poor. Nose: No turbinate hypertrophy. Dentition is good.   NECK:  No thyroid enlargement. No JVD. No carotid bruit. No lymphadenopathy. Normal range of motion.   RESPIRATORY: Clear to auscultation. No wheeze. No rales.  ABDOMEN: Soft. No epigastric tenderness when I examined him. Bowel sounds are present. No organomegaly.   CARDIOVASCULAR: S1 and S2 regular. No murmurs. PMI not  displaced. Good femoral and pedal pulses.   MUSCULOSKELETAL: Strength 5/5 in upper and lower extremities.   SKIN: No skin rashes.   NEUROLOGIC: The patient has cranial nerves intact. Deep tendon reflexes 2+ bilaterally. Sensations are intact. No dysarthria or aphasia.   PSYCHIATRIC: Oriented to time, place, and person.   LABORATORY, DIAGNOSTIC AND RADIOLOGICAL DATA: Chest x-ray shows increased density in the left lung base consistent with atelectasis, and this has progressed since study from May and mildly increased interstitial markings, especially in the perihilar region, suggesting low-grade congestive heart failure. WBC 5.5, hemoglobin 12.3, hematocrit 36.3, platelets 209. Electrolytes: Sodium 138, potassium 4.4, chloride 106, bicarbonate 25, BUN 15, creatinine 1.25, glucose 102. Troponin 0.04. LFTs within normal limits. EKG showed pacemaker rhythm 61 beats per minute.   ASSESSMENT AND PLAN: The  patient is a 81 year old male with chest pain.  1. Chest pain: The patient had nausea when he came in and was slightly dizzy. His blood pressure was elevated and troponins are negative. EKG is normal. His chest pain is likely secondary to blood pressure. He is getting Coreg and HCTZ and ramipril. I will increase the ramipril to 10 mg daily and Coreg is already 25 mg p.o. b.i.d., continue that. Continue aspirin and ACE inhibitors and beta blockers. Cycle cardiac enzymes and add the nitro paste for chest pain. Get BMP. The patient's echocardiogram showed ejection fraction 35 to 45%. He has no pedal edema. He does not look like he is in congestive heart failure, but get the BNP. If the troponins are negative, he be discharged home.  2. Chronic congestive heart failure, systolic: Continue his HCTZ and ACE inhibitors, beta blockers and check daily weights ins and outs.  3. For his chest pain, looks like mostly atypical. Monitored serial troponins and monitor on telemetry, and continue him on beta blockers,  aspirin and statins and ACE. Add nitro paste. Likely underlying gastroesophageal reflux disease present: Continue omeprazole 20 mg daily.  4. History of depression: Continue Celexa 20 mg daily.  5. Constipation: The patient uses MiraLax and prune juice as needed. Continue that.  6. Questionable atelectasis on the x-ray: The patient has no fever, white count has been normal. He would benefit from outpatient follow up on x-ray and also monitor oxygen saturations.   The patient is  Dr. Jarrett Ables patient, so I will sign out to him.   TIME SPENT: About 55 minutes.   ____________________________ Katha Hamming, MD sk:cbb D: 03/13/2012 15:58:15 ET T: 03/13/2012 17:29:56 ET JOB#: [id number]  cc: Katha Hamming, MD, <Dictator> Stann Mainland. Sampson Goon, MD Katha Hamming MD ELECTRONICALLY SIGNED 03/31/2012 19:35

## 2014-09-06 NOTE — Discharge Summary (Signed)
PATIENT NAME:  [name] Snyder, [name] Snyder MR#:  [id number] DATE OF BIRTH:  04/02/1916  DATE OF ADMISSION:  03/13/2012 DATE OF DISCHARGE:  03/14/2012  DISCHARGE DIAGNOSES:  1. Chest discomfort with history of coronary artery disease.  2. Systolic congestive heart failure.  3. Hypertension.  4. Hyperlipidemia.  5. Dementia.  6. Gastroesophageal reflux disease.  7. Depression.   DISCHARGE MEDICATIONS:  1. Coreg 25 mg p.o. b.i.d.  2. Omeprazole 20 mg p.o. b.i.d.  3. Simvastatin 20 mg p.o. at bedtime.  4. Citalopram 20 mg p.o. daily.  5. Ramipril 5 mg p.o. daily.  6. Hydrochlorothiazide 25 mg p.o. daily.   7. Aspirin 81 mg p.o. daily.  8. Nitroglycerin 0.4 mg sublingual q.5 minutes p.r.n. for chest pain up to three doses.   CONSULTS: None.   PROCEDURES: None.   LABORATORY, DIAGNOSTIC AND RADIOLOGICAL DATA: CK-MB within normal limits. Troponin 0.04, 0.08, and 0.08. On day of discharge sodium 139, potassium 4.2, creatinine 1.3. Other labs within normal limits. White blood cell count 4.3, hemoglobin 11.7, platelets 173.   BRIEF HOSPITAL COURSE:  1. Chest discomfort with history of coronary artery disease. Patient initially came in with complaints of intermittent chest discomfort. EKG showed no acute changes. Did show pacing. Initial cardiac enzymes were negative. Repeat CK-MBs were normal. Troponins did go up to 0.08 but patient's symptoms have improved. He was kept for observation because of his history of coronary artery disease, status post coronary artery bypass graft. He is cleared to be discharged. No invasive procedures necessary at this time. Will follow up with Dr. Sampson GoonFitzgerald within 1 to 2 weeks to reassess.  2. Other chronic medical issues remained stable. No changes to those regimens. I did give him a prescription for nitroglycerin as needed for chest pain and he is to call if symptoms recur. He is stable for discharge to home. Follow up as stated above.   ____________________________ Marisue IvanKanhka  Shealee Yordy, MD kl:cms D: 03/14/2012 09:16:01 ET T: 03/15/2012 14:53:28 ET JOB#: [id number]  cc: Marisue IvanKanhka Elonzo Sopp, MD, <Dictator> Stann Mainlandavid P. Sampson GoonFitzgerald, MD  Marisue IvanKANHKA Shaunn Tackitt MD ELECTRONICALLY SIGNED 03/23/2012 8:07

## 2014-09-06 NOTE — Discharge Summary (Signed)
PATIENT NAME:  [name] Snyder, NO MR#:  [id number] DATE OF BIRTH:  24-Feb-1916  DATE OF ADMISSION:  05/04/2012 DATE OF DISCHARGE:  05/08/2012  PRIMARY CARE PHYSICIAN: Clydie Braun, MD  CONSULTING ORTHOPEDIST: Juanell Fairly, MD    HISTORY OF PRESENT ILLNESS: This is a pleasant 81 year old with a history of coronary artery disease, arthritis, diverticulitis, and hip replacement who fell and sustained a periprosthetic femur fracture. It was decided not to have operation and was at home when he fell again fractured his left proximal humerus head. The patient was admitted for medical management of this and his multiple other medical problems.   HOSPITAL COURSE BY ISSUE: 1. Orthopedics. He was seen by Darl Householder, MD in the orthopedics and, again, was made nonoperative management. The plan was for nonweightbearing of the left upper extremity and use of a sling. He is to follow  up in orthopedics as an outpatient. He is to continue touchdown weight bearing per his hip fracture.  2. Gait disturbance. The patient worked intermittently with Physical Therapy; however, he did have issues with confusion and some delirium so it is difficult to have him fully participatory. He is to follow with Rehab and Physical Therapy over the next several weeks at skilled nursing facility in order to try to improve his mobility with the goal to have him become able to participate in activities of daily living.  3. Hypertension. The patient's blood pressure has been elevated in the hospital likely due to pain and some agitation. He was continued on his outpatient dosing of carvedilol 25 mg twice a day, ramipril 5 mg once a day, and Lasix 20 mg was restarted. Plan is to increase his ramipril if needed if blood pressure remains elevated.  4. Delirium. The patient was on morphine, tramadol, and also receiving p.r.n. benzodiazepines and became more lethargic and unable to participate with PT. On 05/07/2012, the morphine,  tramadol, and Ativan were discontinued, and on the day of discharge, he is much more awake and interactive. We will continue pain control with Tylenol as needed.  5. Poor nutrition. The patient's son reports that he has not been eating much in the last several weeks since his hip fracture. We have started him on Boost and will encourage oral intake.  6. Coronary artery disease. He remained on blood pressure control as well as his lipid medicine.  7. GERD. He remains on omeprazole.  8. Constipation. The patient was several days without a bowel movement probably due to immobility and pain medications. He was started on MiraLax and Dulcolax suppository, and had improvement in his bowel function. We will continue to use on a p.r.n. basis.   DISCHARGE PLAN: The patient will be discharged to skilled nursing facility, hopefully for a several week to a month course of treatment before returning home with his son and his wife. The goal the patient expresses is to return home if he can participate in activities of daily living.  DISCHARGE MEDICATIONS:  1. Carvedilol 25 mg twice a day.  2. Lasix 20 mg once a day.  3. Ramipril 5 mg once a day.  4. Omeprazole 20 mg once a day.  5. Simvastatin 20 mg once a day.  6. Tylenol 650 mg 1 tablet 4 times a day as needed for pain.  7. Nitrostat 0.4 mg sublingual tablet as needed for pain.  8. MiraLax 17 grams p.o. daily p.r.n. constipation.  9. Colace 100 mg p.o. twice a day.  10. Dulcolax 10 mg PR  daily p.r.n. constipation.   DISPOSITION: Discharge to home health with PT.  DISCHARGE DURATION: This discharge took 40 minutes.    ____________________________ Stann Mainlandavid P. Sampson GoonFitzgerald, MD dpf:es D: 05/08/2012 12:49:39 ET T: 05/08/2012 13:36:25 ET JOB#: [id number]  cc: Stann Mainlandavid P. Sampson GoonFitzgerald, MD, <Dictator> DAVID Sampson GoonFITZGERALD MD ELECTRONICALLY SIGNED 05/19/2012 21:53

## 2014-09-10 NOTE — Consult Note (Signed)
PATIENT NAME:  [name] ChildsCLAYTON, [name] E MR#:  [id number] DATE OF BIRTH:  04/02/1916  DATE OF CONSULTATION:  04/17/2014  REFERRING PHYSICIAN:   CONSULTING PHYSICIAN:  Midge Miniumarren Nazareth Kirk, MD  CONSULTING SERVICE: Gastroenterology.   REASON FOR CONSULTATION: Cholangitis.   HISTORY OF PRESENT ILLNESS: This patient is a 81 year old gentleman who reportedly had a cholecystectomy in the past. The patient is unable to tell me when he had that done. It was also reported from the admission note that the patient had an ERCP in the past, although there are no reports that I can find in the chart to confirm that the patient has had an ERCP. It was reported that Dr. Bluford Kaufmannh had done the ERCP. The patient was now admitted with an elevated white cell count with signs of cholangitis. The patient had imaging that showed a common bile duct stone. The patient also had fevers, abdominal pain and jaundice on admission. The patient had 2 stones seen, 1 in the mid common bile duct and 1 lower down possibly causing obstruction. The biliary ductal dilatation was seen above the 1.4 cm stone in the mid common bile duct and the 1.3 cm duct was near the level of the ampulla. I am now asked to see the patient for the abnormal liver enzymes and the cholangitis. Overnight, the patient's white cell count had stayed stable at 7.5 with the bilirubin coming down from 2.2 to 1.6 with improvement of the LFTs.   PAST MEDICAL HISTORY: Coronary artery disease, status post CABG, hypertension, BPH, aortic stenosis, third degree heart block.   ALLERGIES: No known drug allergies.  REVIEW OF SYSTEMS: A 10-point review of systems  negative except what was stated above.   HOME MEDICATIONS: Simvastatin, ramipril, Prilosec. Zofran, Lasix, iron, citalopram, Coreg, bisacodyl.   SOCIAL HISTORY: No tobacco, alcohol or drug abuse.   PHYSICAL EXAMINATION:  VITAL SIGNS: Temperature 97.5, pulse 60, respirations 18, blood pressure 113/49, pulse oximetry 96%.  GENERAL: The  patient awake and alert and not orientated completely.  HEENT: Normocephalic, atraumatic. Extraocular motor intact. Pupils equally round and reactive to light and accommodation without JVD, without lymphadenopathy.  LUNGS: Clear to auscultation bilaterally.  HEART: Regular rate and rhythm without gallops or rubs but there was a murmur heard. Pacemaker site appears intact.  ABDOMEN: Soft, nontender, nondistended, positive bowel sounds.  NEUROLOGICAL: Grossly intact.  EXTREMITIES: Without cyanosis, clubbing or edema.  SKIN: Without any rashes or lesions.  MUSCULOSKELETAL: The patient moving all 4 extremities.   ANCILLARY SERVICES: As stated above.   ASSESSMENT AND PLAN: This patient is a 81 year old gentleman who has 2 common bile duct stones. The patient's son was explained the situation of the cholangitis from the patient's obstruction. The son agrees with proceeding with an ERCP for tomorrow. The patient will be kept n.p.o. after midnight for an ERCP tomorrow.   Thank you very much for involving me in the care of this patient. If you have any questions, please do not hesitate to call.    ____________________________ Midge Miniumarren Levester Waldridge, MD dw:TT D: 04/17/2014 14:01:52 ET T: 04/17/2014 16:29:36 ET JOB#: [id number]  cc: Midge Miniumarren Khushboo Chuck, MD, <Dictator> Midge MiniumARREN Mckinzie Saksa MD ELECTRONICALLY SIGNED 04/17/2014 19:57

## 2014-09-10 NOTE — Consult Note (Signed)
Brief Consult Note: Diagnosis: BRBPR, anemia.   Consult note dictated.   Discussed with Attending MD.   Comments: Patient seen and examined. He presented with BRBPR and dizziness since 10/12/13. He described the blood as bright red, but with "very dark" color too it. He described this as black. No nasuea or vomiting, no abdominal pain, no trouble wiht his appetite. he has been eating well without difficulty. No heartburn. No diarrhea or constipation. No rectal pain.  The patient was seen back in 2010 with hematochezia during which time he was diagnosed with a diverticular bleed that self-resolved with supportive measures.  I agree with PPI therapy due to possible black stools. Will request nursing staff to monitor future stools to see if we can decipher whether this is red blood or melena.  Serial hgbs. If evidence of active bleeding, would recommend tagged RBC scan to ID source. Will follow closely. full consult being dictated.  Electronic Signatures: Ashok Cordiaarle, Andriel Omalley M (PA-C)  (Signed 28-May-15 16:52)  Authored: Brief Consult Note   Last Updated: 28-May-15 16:52 by Ashok CordiaEarle, Jasper Ruminski M (PA-C)

## 2014-09-10 NOTE — Consult Note (Signed)
Brief Consult Note: Diagnosis: Patient with cholangitis and 2 CBD stones on CT scan. The patient has had his gall bladder removed in the past.   Patient was seen by consultant.   Consult note dictated.   Comments: The patient will be set up for an ERCP and stone extraction tomorrow.  Electronic Signatures: Midge MiniumWohl, Berdene Askari (MD)  (Signed [phone]-Nov-15 07:48)  Authored: Brief Consult Note   Last Updated: 29-Nov-15 07:48 by Midge MiniumWohl, Irys Nigh (MD)

## 2014-09-10 NOTE — Consult Note (Signed)
Chief Complaint:  Subjective/Chief Complaint seen for hematochezia.  Patient feeling a little better today, no bm or evidence  of recurrent bleeding.  denies abdominal pain or nausea, tolerating clears.  Of note patietn has been taking BC powders daily at home.   VITAL SIGNS/ANCILLARY NOTES: **Vital Signs.:   31-May-15 14:02  Vital Signs Type Routine  Celsius 36.7  Temperature Source oral  Pulse Pulse 66  Respirations Respirations 19  Systolic BP Systolic BP 657  Diastolic BP (mmHg) Diastolic BP (mmHg) 76  Mean BP 105  Pulse Ox % Pulse Ox % 99  Pulse Ox Activity Level  At rest  Oxygen Delivery 2L   Brief Assessment:  Cardiac Regular   Respiratory clear BS   Gastrointestinal details normal Soft  Nontender  Nondistended  No masses palpable  Bowel sounds normal   Lab Results: Routine Chem:  31-May-15 06:58   Glucose, Serum 95  BUN 18  Creatinine (comp) 1.22  Sodium, Serum 140  Potassium, Serum 3.9  Chloride, Serum  112  CO2, Serum 22  Calcium (Total), Serum  8.3  Anion Gap  6  Osmolality (calc) 281  eGFR (African American)  58  eGFR (Non-African American)  50 (eGFR values <65m/min/1.73 m2 may be an indication of chronic kidney disease (CKD). Calculated eGFR is useful in patients with stable renal function. The eGFR calculation will not be reliable in acutely ill patients when serum creatinine is changing rapidly. It is not useful in  patients on dialysis. The eGFR calculation may not be applicable to patients at the low and high extremes of body sizes, pregnant women, and vegetarians.)  Routine Coag:  28-May-15 10:54   INR 1.2 (INR reference interval applies to patients on anticoagulant therapy. A single INR therapeutic range for coumarins is not optimal for all indications; however, the suggested range for most indications is 2.0 - 3.0. Exceptions to the INR Reference Range may include: Prosthetic heart valves, acute myocardial infarction, prevention of  myocardial infarction, and combinations of aspirin and anticoagulant. The need for a higher or lower target INR must be assessed individually. Reference: The Pharmacology and Management of the Vitamin K  antagonists: the seventh ACCP Conference on Antithrombotic and Thrombolytic Therapy. CQIONG.2952Sept:126 (3suppl): [id number] A HCT value >55% may artifactually increase the PT.  In one study,  the increase was an average of 25%. Reference:  "Effect on Routine and Special Coagulation Testing Values of Citrate Anticoagulant Adjustment in Patients with High HCT Values." American Journal of Clinical Pathology 2006;126:400-405.)  Routine Hem:  28-May-15 10:54   Hemoglobin (CBC)  7.5    20:01   Hemoglobin (CBC)  8.5 (Result(s) reported on 14 Oct 2013 at 08:18PM.)  29-May-15 02:10   Hemoglobin (CBC)  7.5    10:20   Hemoglobin (CBC)  7.7 (Result(s) reported on 15 Oct 2013 at 11:09AM.)    16:05   Hemoglobin (CBC)  8.0 (Result(s) reported on 15 Oct 2013 at 04:34PM.)  30-May-15 05:40   Hemoglobin (CBC)  6.9 (Result(s) reported on 16 Oct 2013 at 06:35AM.)    16:50   Hemoglobin (CBC)  9.2 (Result(s) reported on 16 Oct 2013 at 05:21PM.)  31-May-15 06:58   Hemoglobin (CBC)  7.7 (Result(s) reported on 17 Oct 2013 at 07:39AM.)   Assessment/Plan:  Assessment/Plan:  Assessment 1) hematochezia-likely lower bleeding, diverticular. Again of note, patient has been taking BC powders daily regularly pta. I discussed this with him and his son.  I discussed the approach to lower GI bleedign to include  bleeding scan, vascular consutl and possible tx by angio/microembolization if positive rather than proceeding with a colonoscopy, etc.  They indicate understanding.  Currently patient is hemodynamically stable and no ongoing bleeding evidence.  Continue serial hgb, transfuse as needed, avoid asa/anticoagulants for now.  Bleeding scan if recurrent overt bleeding.  If patient continues to take asa products I  recommend placing on a bid h2ra for ulcer prophylaxis, though avoidance of asa would be best.  following.   Electronic Signatures: Loistine Simas (MD)  (Signed [phone] 17:23)  Authored: Chief Complaint, VITAL SIGNS/ANCILLARY NOTES, Brief Assessment, Lab Results, Assessment/Plan   Last Updated: 31-May-15 17:23 by Loistine Simas (MD)

## 2014-09-10 NOTE — Consult Note (Signed)
Chief Complaint:  Subjective/Chief Complaint Pt denies any abdominal pain, nausea or vomiting.  Tolerated diet well.   Brief Assessment:  GEN well developed, well nourished, no acute distress, A/Ox3   Cardiac Regular   Respiratory normal resp effort   Gastrointestinal details normal Soft  Nontender  Nondistended  No masses palpable  Bowel sounds normal   EXTR negative cyanosis/clubbing, negative edema   Additional Physical Exam Skin: pink, warm, dry   Lab Results: Hepatic:  01-Dec-15 04:51   Bilirubin, Total 0.7  Bilirubin, Direct  0.3 (Result(s) reported on 19 Apr 2014 at 06:04AM.)  Alkaline Phosphatase  139 (46-116 NOTE: New Reference Range 12/07/13)  SGPT (ALT) 31 (14-63 NOTE: New Reference Range 12/07/13)  SGOT (AST) 24  Total Protein, Serum  5.9  Albumin, Serum  2.3  Routine Chem:  01-Dec-15 04:51   Lipase 291 (Result(s) reported on 19 Apr 2014 at 05:59AM.)   Assessment/Plan:  Assessment/Plan:  Assessment Choledocholithiaisis: Doing well s/p ERCP with sphincterotomy & balloon extraction.  LFTs improving.  Hopeful DC home soon.  No further GI intervention at this time.   Electronic Signatures: Joselyn ArrowJones, Maclain Cohron L (NP)  (Signed 01-Dec-15 13:03)  Authored: Chief Complaint, Brief Assessment, Lab Results, Assessment/Plan   Last Updated: 01-Dec-15 13:03 by Joselyn ArrowJones, Carlisle Torgeson L (NP)

## 2014-09-10 NOTE — Consult Note (Signed)
Brief Consult Note: Diagnosis: Right hip pain.   Recommend further assessment or treatment.   Comments: Asked to see patient for hip pain in conjunction with severe Gastroenterology bleed.  No X-rays have been done.  Have requested X-rays for tonight and will see patient tomorrow.  Electronic Signatures: Valinda HoarMiller, Meklit Cotta E (MD)  (Signed 29-May-15 17:41)  Authored: Brief Consult Note   Last Updated: 29-May-15 17:41 by Valinda HoarMiller, Tyeesha Riker E (MD)

## 2014-09-10 NOTE — Discharge Summary (Signed)
PATIENT NAME:  [name] ChildsCLAYTON, Brownie E MR#:  [id number] DATE OF BIRTH:  November 15, 1915  PRIMARY CARE PHYSICIAN:  Stann Mainlandavid P. Sampson GoonFitzgerald, MD  GASTROENTEROLOGY: Midge Miniumarren Wohl, MD  CHIEF COMPLAINT: Abdominal pain and epigastric pain with nausea and vomiting.   DISCHARGE DIAGNOSIS: Acute cholangitis with acute choledocholithiasis, status post endoscopic retrograde cholangiopancreatography.   PROCEDURES: ERCP done by Dr. Servando SnareWohl,  November 30, showed major papilla located entirely within diverticulum. Major papilla appeared to be bulging, filling defect consistent with a stone and sludge was seen on cholangiogram. Choledocholithiasis was found. Complete removal was accomplished by biliary sphincterotomy and balloon extraction.   CODE STATUS: No code, DO NOT RESUSCITATE.   MEDICATIONS: 1. Carvedilol 25 mg b.i.d.  2.  Prilosec 20 mg p.o. daily.  3.  Bisacodyl 5 mg 2 tablets daily as needed.  4.  Zofran 4 mg every 4 hours as needed.  5.  Ferrous sulfate 325 p.o. daily.  6.  Lasix 40 mg daily.  7.  Donepezil 10 mg at bedtime.  8.  Citalopram 10 mg daily.  9.  Ramipril 5 mg daily.  10.  Haldol 0.5 mg 1 tablet every 8 hours as needed.  11.  Augmentin 500 b.i.d.  12.  Simvastatin 20 mg p.o. daily at bedtime.   FOLLOWUP:  1.  Follow up with Dr. Servando SnareWohl in 1-2 weeks.  2.  Follow up with Dr. Sampson GoonFitzgerald in 1-2 weeks.   LABORATORY DATA: Lipase is 291, bilirubin is 0.3, alkaline phosphatase is 139. Total protein is 5.9. White count is 5.3. Creatinine is 1.56, sodium is 140, potassium is 3.9.   BRIEF SUMMARY OF HOSPITAL COURSE:  1.  [name] is a pleasant 81 year old Caucasian gentleman with history of hypertension, came in with nausea, vomiting, and abdominal pain. He was found to have acute cholangitis with choledocholithiasis. He was seen by Dr. Servando SnareWohl, who recommended ERCP which the patient underwent successfully with removal of stone and sludge. He empirically was on antibiotics, changed to p.o. Augmentin. Followup  LFTs and lipase after ERCP remained stable. The patient was able to tolerate a full liquid diet prior to discharge.  2.  Acute renal failure secondary to nausea and vomiting, improved creatinine with IV hydration.  3.  History of CAD, status post CABG, resumed Coreg. 4.  History of BPH. Not on any medications.  5.  Generalized weakness. Physical therapy recommends home health PT, which has been arranged.   Overall hospital stay otherwise remained stable.   TIME SPENT: 40 minutes.     ____________________________ Wylie HailSona A. Allena KatzPatel, MD sap:LT D: 04/19/2014 16:41:00 ET T: 04/19/2014 17:56:34 ET JOB#: [id number]  cc: Kolten Ryback A. Allena KatzPatel, MD, <Dictator> Midge Miniumarren Wohl, MD Stann Mainlandavid P. Sampson GoonFitzgerald, MD  Willow OraSONA A Alaura Schippers MD ELECTRONICALLY SIGNED 04/21/2014 14:13

## 2014-09-10 NOTE — H&P (Signed)
PATIENT NAME:  [name] Snyder, [name] Snyder MR#:  [id number] DATE OF BIRTH:  1916-03-28  DATE OF ADMISSION:  10/22/2013  REFERRING PHYSICIAN:  Dr. Darnelle CatalanMalinda  PRIMARY CARE PHYSICIAN: Dr. Sampson GoonFitzgerald  CHIEF COMPLAINT: Unresponsive.  HISTORY OF PRESENT ILLNESS:  A 81 year old Caucasian gentleman with past medical history of hypertension, BPH, coronary artery disease status post CABG presenting in unresponsive state.  Recently discharged from [facility] approximately 2 days ago for a GI bleed requiring 2 unit blood transfusion.  He was currently residing [facility] and noted to be minimally responsive as well as hypoxemic.  However, there are no further documented symptoms.  The patient is unable to provide any further information at this time.  He is noted to be saturating in the 80% range on room air.  Currently saturating in the mid 90s on supplemental O2.  Once again, unable to provide any further information given mental status and medical condition.  REVIEW OF SYSTEMS: Unobtainable given patients mental status and medical condition.  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  FAMILY HISTORY:  No documentation of cardiovascular or pulmonary illnesses.  SOCIAL HISTORY:  No documentation of use of tobacco, alcohol, or drug use.  Currently residing [facility] for the last 2 days.  ALLERGIES:  No known drug allergies.   HOME MEDICATIONS: Include ramipril 5 mg p.o. daily, citalopram 20 mg p.o. daily, simvastatin 20 mg p.o. at bedtime, Coreg 25 mg p.o. b.i.d., Lasix 40 mg p.o. daily, Feosol 325 mg p.o. daily, bisacodyl 5 mg 2 tablets once daily as needed for constipation, Prilosec 20 mg p.o. daily.  PHYSICAL EXAMINATION: VITAL SIGNS:  Temperature 98.3, heart rate 66, respirations 20, blood pressure 136/60, saturating 96% on room air, weight 77.1  kg, BMI 25.9. GENERAL: Chronically ill-appearing Caucasian gentleman in minimal to moderate distress given mental status.   HEAD:  Normocephalic, atraumatic. EYES:  Pupils equal, round, sluggishly reactive to light.  Unable to fully assess extraocular muscles as he is unable to follow commands.  There is no scleral icterus. MOUTH:  Dry mucosal membrane.  Dentition poor.  No abscess noted.   EARS, NOSE, AND THROAT:  Clear without exudates.  No external lesions. NECK:  Supple.  No thyromegaly, no nodules, no JVD.  PULMONARY:  Diffuse coarse breath sounds bilaterally throughout all lung fields.  No use of accessory muscles.  Good respiratory effort.  Chest nontender to palpation. CARDIOVASCULAR:  S1, S2, regular rate and rhythm.  No murmurs, rubs, or gallops.  No edema.  Pedal pulses 2+ bilaterally.   GASTROINTESTINAL: Soft and nontender, nondistended.  No masses.  Positive bowel sounds.  No hepatosplenomegaly.   MUSCULOSKELETAL: No swelling, clubbing, or edema.  Range of motion possibly full in all extremities.   NEUROLOGICAL:  Unable to fully assess given patients current mental status.  However, he is able to follow simple commands though unable to (Dictation Anomaly) <<MISSING TEXT>> sensation is intact.  Reflexes are intact at this time. SKIN:  No ulcerations, lesions, rashes, or cyanosis.  Skin warm and dry.  Turgor intact.   PSYCHIATRIC:  Unable to fully assess given patients current mental status.  He is lying in bed with his eyes closed.  He is able to localize painful stimuli.  Minimally responsive with verbal stimuli, though he does appear to be able to follow simple commands such as hand squeezing.  LABORATORY DATA: Sodium  140, potassium 4.1, chloride 113, bicarbonate 19, anion gap of 8, BUN 20, creatinine 1.47, glucose 102.  LFTs:  Total protein 5.9, albumin 2.4, bilirubin is 1.8, alkaline phosphatase 227, AST 59, ALT 33. Troponin I of 0.03. TSH 1.84.  WBC 11.4, hemoglobin 8.4, platelets of  272,000.  Urinalysis negative for evidence of infection.   CT head performed reveals no acute intracranial process.  Chest x-ray performed reveals small bilateral pleural effusions.  No infiltrate or edema.  An abdominal ultrasound revealing intrahepatic and extrahepatic biliary duct dilatation.  ASSESSMENT AND PLAN: A 81 year old gentleman presenting after being in an unresponsive state. 1. Encephalopathy, unclear etiology.  Neurological checks q. 4 hours.  Minimize sedating agents. 2. Acute kidney injury.  Hold nephrotoxic agents including ACE inhibitors and diuretics.  Provide gentle IV fluid hydration. 3. Transaminitis.  Noted biliary dilatation without evidence of infection or inflammation.  Follow LFTs. 4. Hypertension.  Add hydralazine p.r.n. for systolic greater than 180.  Continue with Coreg.  5. Venous thromboembolism prophylaxis with sequential compression devices.  CODE STATUS: Patient DNR.  TIME SPENT:  45 minutes.   ____________________________ Durward Mallard Marguerite Olea, MD jbm:dd D: 10/22/2013 00:27:51 ET T: 10/22/2013 02:29:29 ET JOB#: [id number]  cc: Durward Mallard. Marguerite Olea, MD, <Dictator>

## 2014-09-10 NOTE — H&P (Signed)
PATIENT NAME:  [name] Snyder, [name] Snyder MR#:  [id number] DATE OF BIRTH:  [birth year]  DATE OF ADMISSION:  10/21/2013  REFERRING PHYSICIAN:  Dr. Darnelle Catalan  PRIMARY CARE PHYSICIAN: Dr. Sampson Goon  CHIEF COMPLAINT: Unresponsive.  HISTORY OF PRESENT ILLNESS:  A 81 year old Caucasian gentleman with past medical history of hypertension, BPH, coronary artery disease status post CABG presenting in unresponsive state.  Recently discharged from [facility] approximately 2 days ago for a GI bleed requiring 2 unit blood transfusion.  He was currently residing at [facility] nursing facility and noted to be minimally responsive as well as hypoxemic.  However, there are no further documented symptoms.  The patient is unable to provide any further information at this time.  He is noted to be saturating in the 80% range on room air.  Currently saturating in the mid 90s on supplemental O2.  Once again, unable to provide any further information given mental status and medical condition.  REVIEW OF SYSTEMS: Unobtainable given patient's mental status and medical condition.  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  FAMILY HISTORY:  No documentation of cardiovascular or pulmonary illnesses.  SOCIAL HISTORY:  No documentation of use of tobacco, alcohol, or drug use.  Currently residing at [facility] nursing facility for the last 2 days.  ALLERGIES:  No known drug allergies.   HOME MEDICATIONS: Include ramipril 5 mg p.o. daily, citalopram 20 mg p.o. daily, simvastatin 20 mg p.o. at bedtime, Coreg 25 mg p.o. b.i.d., Lasix 40 mg p.o. daily, Feosol 325 mg p.o. daily, bisacodyl 5 mg 2 tablets once daily as needed for constipation, Prilosec 20 mg p.o. daily.  PHYSICAL EXAMINATION: VITAL SIGNS:  Temperature 98.3, heart rate 66, respirations 20, blood pressure 136/60, saturating 96% on room air, weight  77.1 kg, BMI 25.9. GENERAL: Chronically ill-appearing Caucasian gentleman in minimal to moderate distress given mental status.   HEAD:  Normocephalic, atraumatic. EYES:  Pupils equal, round, sluggishly reactive to light.  Unable to fully assess extraocular muscles as he is unable to follow commands.  There is no scleral icterus. MOUTH:  Dry mucosal membrane.  Dentition poor.  No abscess noted.   EARS, NOSE, AND THROAT:  Clear without exudates.  No external lesions. NECK:  Supple.  No thyromegaly, no nodules, no JVD.  PULMONARY:  Diffuse coarse breath sounds bilaterally throughout all lung fields.  No use of accessory muscles.  Good respiratory effort.  Chest nontender to palpation. CARDIOVASCULAR:  S1, S2, regular rate and rhythm.  No murmurs, rubs, or gallops.  No edema.  Pedal pulses 2+ bilaterally.   GASTROINTESTINAL: Soft and nontender, nondistended.  No masses.  Positive bowel sounds.  No hepatosplenomegaly.   MUSCULOSKELETAL: No swelling, clubbing, or edema.  Range of motion possibly full in all extremities.   NEUROLOGICAL:  Unable to fully assess given patient's current mental status.  However, he is able to follow simple commands though unable to answer if sensation is intact.  Reflexes are intact at this time. SKIN:  No ulcerations, lesions, rashes, or cyanosis.  Skin warm and dry.  Turgor intact.   PSYCHIATRIC:  Unable to fully assess given patient's current mental status.  He is lying in bed with his eyes closed.  He is able to localize painful stimuli.  Minimally responsive with verbal stimuli, though he does appear to be able to follow simple commands such as hand squeezing.  LABORATORY DATA: Sodium 140, potassium  4.1, chloride 113, bicarbonate 19, anion gap of 8, BUN 20, creatinine 1.47, glucose 102.  LFTs:  Total protein 5.9, albumin 2.4, bilirubin is 1.8, alkaline phosphatase 227, AST 59, ALT 33. Troponin I of 0.03. TSH 1.84.  WBC 11.4, hemoglobin 8.4, platelets of 272,000.  Urinalysis  negative for evidence of infection.   CT head performed reveals no acute intracranial process.  Chest x-ray performed reveals small bilateral pleural effusions.  No infiltrate or edema.  An abdominal ultrasound revealing intrahepatic and extrahepatic biliary duct dilatation.  ASSESSMENT AND PLAN: A 81 year old gentleman presenting after being in an unresponsive state. 1. Encephalopathy, unclear etiology.  Neurological checks q. 4 hours.  Minimize sedating agents. 2. Acute kidney injury.  Hold nephrotoxic agents including ACE inhibitors and diuretics.  Provide gentle IV fluid hydration. 3. Transaminitis.  Noted biliary dilatation without evidence of infection or inflammation.  Follow LFTs. 4. Hypertension.  Add hydralazine p.r.n. for systolic greater than 180.  Continue with Coreg.  5. Venous thromboembolism prophylaxis with sequential compression devices.  CODE STATUS: Patient DNR.  TIME SPENT:  45 minutes.   ____________________________ Cletis Athensavid K. Hower, MD dkh:dd D: 10/22/2013 00:27:00 ET T: 10/22/2013 02:29:29 ET JOB#: [id number]  cc: Cletis Athensavid K. Hower, MD, <Dictator> DAVID Synetta ShadowK HOWER MD ELECTRONICALLY SIGNED 10/26/2013 21:06

## 2014-09-10 NOTE — Discharge Summary (Signed)
PATIENT NAME:  [name] Snyder MR#:  [id number] DATE OF BIRTH:  09/11/1915  DATE OF ADMISSION:  10/14/2013 DATE OF DISCHARGE:  10/20/2013  Addendum  Please see discharge summary. The patient did not obtain a bed yesterday, so he is being discharged today. He remained stable overnight. He is resumed on his outpatient medications. His hemoglobin today was 8.0. His renal function today on labs showed a creatinine of 1.31 and a BUN of 115. He will be followed up at a skilled nursing facility.   ____________________________ [name] Mainlandavid P. Sampson GoonFitzgerald, MD dpf:dmm D: 10/20/2013 12:34:53 ET T: 10/20/2013 12:41:05 ET JOB#: [id number]  cc: [name] Mainlandavid P. Sampson GoonFitzgerald, MD, <Dictator> [name] Berenguer Sampson GoonFITZGERALD MD ELECTRONICALLY SIGNED 10/22/2013 16:43

## 2014-09-10 NOTE — Consult Note (Signed)
Chief Complaint:  Subjective/Chief Complaint Initial rise in hgb with blood transfusion but hgb dropped again. No signs of rectal bleeding, according to nurse. Son upset about patient care. Feels "nothing" is being done.   VITAL SIGNS/ANCILLARY NOTES: **Vital Signs.:   29-May-15 04:00  Vital Signs Type Routine  Temperature Temperature (F) 98.8  Celsius 37.1  Temperature Source oral  Pulse Pulse 73  Respirations Respirations 18  Systolic BP Systolic BP 701  Diastolic BP (mmHg) Diastolic BP (mmHg) 59  Mean BP 81  Pulse Ox % Pulse Ox % 97  Pulse Ox Activity Level  At rest  Oxygen Delivery Room Air/ 21 %   Brief Assessment:  GEN no acute distress   Cardiac Regular   Respiratory clear BS   Lab Results: Routine Chem:  29-May-15 02:10   Glucose, Serum  105  BUN  32  Creatinine (comp)  1.67  Sodium, Serum 141  Potassium, Serum 3.7  Chloride, Serum  110  CO2, Serum 25  Calcium (Total), Serum  8.2  Anion Gap  6  Osmolality (calc) 289  eGFR (African American)  39  eGFR (Non-African American)  34 (eGFR values <61m/min/1.73 m2 may be an indication of chronic kidney disease (CKD). Calculated eGFR is useful in patients with stable renal function. The eGFR calculation will not be reliable in acutely ill patients when serum creatinine is changing rapidly. It is not useful in  patients on dialysis. The eGFR calculation may not be applicable to patients at the low and high extremes of body sizes, pregnant women, and vegetarians.)  Magnesium, Serum 1.9 (1.8-2.4 THERAPEUTIC RANGE: 4-7 mg/dL TOXIC: > 10 mg/dL  -----------------------)  Routine Hem:  29-May-15 02:10   Hemoglobin (CBC)  7.5  WBC (CBC) 6.7  RBC (CBC)  2.38  Hematocrit (CBC)  22.4  Platelet Count (CBC) 157  MCV 94  MCH 31.6  MCHC 33.6  RDW  17.6  Neutrophil % 64.8  Lymphocyte % 15.5  Monocyte % 16.0  Eosinophil % 3.3  Basophil % 0.4  Neutrophil # 4.3  Lymphocyte # 1.0  Monocyte #  1.1  Eosinophil # 0.2   Basophil # 0.0 (Result(s) reported on 15 Oct 2013 at 02:50AM.)    10:20   Hemoglobin (CBC)  7.7 (Result(s) reported on 15 Oct 2013 at 11:09AM.)   Assessment/Plan:  Assessment/Plan:  Assessment GI bleeding. Likely diverticular bleeding. Drop in hgb but no active bleeding right now.   Plan Discussed some options with son, including repeating colonoscopy, which son declined. Other options incl angiography if bleeding scan shows active bleeding. Surgery is an option if bleeding continues as well. These all have increased risks due to pt's age. Son wants to discuss with primary.  Will have Dr. SGustavo Lahcover for me this weekend. thanks.   Electronic Signatures: OVerdie Shire(MD)  (Signed [id number])  Authored: Chief Complaint, VITAL SIGNS/ANCILLARY NOTES, Brief Assessment, Lab Results, Assessment/Plan   Last Updated: 29-May-15 11:42 by OVerdie Shire(MD)

## 2014-09-10 NOTE — Discharge Summary (Signed)
PATIENT NAME:  [name] ChildsCLAYTON, Mccartney E MR#:  [id number] DATE OF BIRTH:  04/04/1916  DATE OF ADMISSION:  10/21/2013 DATE OF DISCHARGE:  10/27/2013   DISCHARGE DIAGNOSES:  1. Metabolic encephalopathy and altered mental status.  2. Congestive heart failure, acute on chronic systolic.  3. Aortic stenosis.  4. Coronary artery disease.  5. Recent gastrointestinal bleed.  6. Anemia of acute blood loss.   HISTORY OF PRESENT ILLNESS: Please see initial history and physical for details. Briefly, this is a 81 year old gentleman who was recently admitted for lower gastrointestinal bleed. He had required 2 transfusions during that admission. He was discharged to skilled nursing facility, but became altered there and was discharged back to the hospital. He was admitted and was hypoxic on room air. He was found to have some heart failure as well as an elevated creatinine.   HOSPITAL COURSE BY ISSUE:  1. Congestive heart failure. The patient was diuresed gently. His fluid balance improved. He was transitioned to oral Lasix.  2. Hypertension. Blood pressure was controlled with carvedilol, Lasix and his ACE inhibitor. 3. Altered mental status. This improved with holding any sedating meds and reorientation. He was seen by physical therapy and recommended for skilled nursing facility. He will be discharged today and will follow up in 1 to 2 weeks.   DISCHARGE MEDICATIONS: Please see [facility] physician discharge summary.   DISCHARGE OXYGEN: Yes, 2 liters by nasal cannula.   DISCHARGE DIET: Low sodium, mechanical soft, pureed diet with meds in applesauce.  DISCHARGE CODE STATUS: DNR.   DISCHARGE FOLLOWUP: Follow up with Dr. Sampson GoonFitzgerald in 1 to 2 weeks.    TIME SPENT: This discharge took 40 minutes.   ____________________________ Stann Mainlandavid P. Sampson GoonFitzgerald, MD dpf:lb D: 10/27/2013 08:42:07 ET T: 10/27/2013 08:56:43 ET JOB#: [id number]  cc: Stann Mainlandavid P. Sampson GoonFitzgerald, MD, <Dictator> Shantaya Bluestone Sampson GoonFITZGERALD MD ELECTRONICALLY SIGNED  11/01/2013 8:59

## 2014-09-10 NOTE — Discharge Summary (Signed)
PATIENT NAME:  [name] Snyder, [name] Snyder MR#:  [id number] DATE OF BIRTH:  [birth year]  DATE OF ADMISSION:  10/14/2013 DATE OF DISCHARGE:  10/19/2013  DISCHARGE DIAGNOSES: 1.  Acute lower gastrointestinal bleed.  2.  Acute blood loss anemia, status post 2 units.  3.  Right hip pain.  4.  Hypertension.   CONSULTATIONS:  Dr. Bluford Kaufmannh in GI.    HISTORY OF PRESENT ILLNESS: Please see initial history and physical. Briefly, this is a 81 year old gentleman who was admitted with rectal bleeding. He has a prior history of diverticular bleeding.  1.  Gastrointestinal bleed. This was acute and he required 2 units of transfusions over the next 2 days but his bleeding stopped. He started having normal soft bowel movements without issue. He was seen by Dr. Bluford Kaufmannh who deferred on any further workup at this point. He was started on a PPI. He had been taking some NSAIDs and was advised to stop this. If the bleeding recurs, he will have to be re-evaluated but, given his advanced age and comorbidities, no further workup was done.  2.  Right hip pain. He was seen by Ortho. He had an x-ray done. This was improving with physical therapy.  3.  Hypertension. Remains on carvedilol and ramipril and was restarted on his Lasix.  4.  Hyperlipidemia. He remains on simvastatin.  5.  Hypoxia. He was likely a little volume overload. He was weaned off of oxygen but needs to still stay on a low dose to keep his oxygen over 90%. This can be weaned after discharge to skilled nursing facility.  6.  Disposition. He was seen by physical therapy who recommended. SNF. He will return home after rehab.   DISCHARGE MEDICATIONS: Please see [facility] physician discharge summary. These include: Simvastatin 20 once a day, carvedilol 25 twice a day, ramipril 5 mg once a day, citalopram 20 mg once a day, Lasix 40 mg once a day, Prilosec 20 mg once a day, ferrous sulfate 325 once a day, bisacodyl 5 mg once a day as needed for constipation.   DISCHARGE FOLLOWUP: The patient  will follow up with Dr. Sampson GoonFitzgerald in 1 to 2 weeks.   DISCHARGE DIET: Low sodium, regular consistency.   DISCHARGE CODE STATUS: Full code.   DISCHARGE LABORATORY DATA: On the day of discharge, June 2nd, his hemoglobin was stable at 8.0. Renal function the day prior showed a creatinine of 1.23.   TIME SPENT:  This discharge took 40 minutes.   ____________________________ Stann Mainlandavid P. Sampson GoonFitzgerald, MD dpf:cs D: 10/19/2013 14:57:53 ET T: 10/19/2013 15:29:20 ET JOB#: [id number]  cc: Stann Mainlandavid P. Sampson GoonFitzgerald, MD, <Dictator> Lenea Bywater Sampson GoonFITZGERALD MD ELECTRONICALLY SIGNED 10/22/2013 16:42

## 2014-09-10 NOTE — Consult Note (Signed)
Pt seen and examined. Please see K. Earle's notes. Known hx of diverticulosis. Last episode of bleeding yesterday. No sxs now. Likely diverticular bleed. Agree with blood transfusion. Hopefully, bleeding will stop on own. Will follow. Thanks.  Electronic Signatures: Lutricia Feilh, Izayah Miner (MD)  (Signed on 28-May-15 20:05)  Authored  Last Updated: 28-May-15 20:05 by Lutricia Feilh, Brown Dunlap (MD)

## 2014-09-10 NOTE — H&P (Signed)
PATIENT NAME:  [name] Snyder, [name] Snyder MR#:  [id number] DATE OF BIRTH:  Sep 18, 1915  DATE OF ADMISSION:  04/16/2014  PRIMARY CARE PHYSICIAN: Stann Mainland. Sampson Goon, MD  REFERRING EMERGENCY ROOM PHYSICIAN: Coolidge Breeze, MD   CHIEF COMPLAINT: Abdominal pain in the epigastric area associated with nausea and vomiting.   HISTORY OF PRESENT ILLNESS: The patient is a 81 year old, pleasant, Caucasian male who came into the ED with a chief complaint of epigastric abdominal pain associated with intractable nausea and vomiting since this morning. According to the patient's son, the patient started vomiting at around 9:00 and was complaining of abdominal pain. As he was vomiting back-to-back and had 8 intractable episodes of vomiting, the patient is brought into the ED. The patient was feeling dry and he was given pain medicines in the ED. Following giving the pain medication, abdominal pain is significantly improved. The patient was also given antiemetics in the ED. The patient's lab work has revealed elevated LFTs with elevated alkaline phosphatase and AST as well as ALT. The patient had a cholecystectomy in the past, and also ERCP was done in the past by Dr. Bluford Kaufmann for choledocholithiasis. The patient's chest x-ray has revealed atelectasis or pneumonia at both lung bases, and for intractable nausea, vomiting, and abdominal pain with elevated LFTs, CAT scan of the abdomen and pelvis was done with contrast. That has revealed choledocholithiasis causing biliary obstruction. Hospitalist team is called to admit the patient. GI consult was placed by the ED physician, Dr. Derrill Kay. During my examination, the patient is reporting that abdominal pain is resolved. His temperature was at 100.2. Complaining of dry mouth and wanted to drink some clear liquids. Son is at bedside. No other complaints.   PAST MEDICAL HISTORY: Coronary artery disease status post CABG, hypertension, benign prostatic hypertrophy, history of aortic stenosis,  third-degree heart block status post permanent pacemaker insertion.   PAST SURGICAL HISTORY: Status post permanent pacemaker placement for third-degree heart block, coronary artery bypass grafting.   ALLERGIES: No known drug allergies.   PSYCHOSOCIAL HISTORY: Lives at home with son. No history of smoking, alcohol, or illicit drug usage.   FAMILY HISTORY: Hypertension runs in her family.  REVIEW OF SYSTEMS:  CONSTITUTIONAL: Denies any weight loss, but complaining of fever.  EYES: Denies blurry vision, double vision.  ENT: Denies any epistaxis, discharge. RESPIRATION: Denies cough, COPD, or hemoptysis.  CARDIOVASCULAR: Denies any chest pain or palpitations. Has a permanent pacemaker. Status post coronary artery bypass grafting.  GASTROINTESTINAL: Complaining of nausea, vomiting, epigastric abdominal pain radiating to the right upper quadrant. Denies any hematemesis or melena. NEUROLOGIC: Denies any vertigo, ataxia.  PSYCHIATRIC: Denies any ADD, OCD.   HOME MEDICATIONS: Simvastatin 20 mg p.o. once daily, ramipril 10 mg p.o. once daily, Prilosec 20 mg p.o. once daily, Zofran 4 mg p.o. every 4 hours as needed, Lasix 40 mg p.o. once daily, iron sulfate 325 mg 1 tablet p.o. once daily, citalopram 20 mg p.o. once daily, Coreg 25 mg 1 tablet p.o. 2 times a day, bisacodyl 5 mg 2 tablets p.o. once daily as needed for constipation.   PHYSICAL EXAMINATION:  VITAL SIGNS: Temperature 98.4, pulse 62, respirations 18, blood pressure 131/62, pulse oximetry is 99%.  GENERAL APPEARANCE: Not in any acute distress. Moderately built and thin-looking Caucasian male in no apparent distress.  HEENT: Normocephalic, atraumatic. Pupils are equally reacting to light and accommodation. Positive scleral icterus. No conjunctival injection. No sinus tenderness. No postnasal drip. Dry mucous membranes.  NECK: Supple. No JVD. No thyromegaly.  Range of motion is intact.  LUNGS: Clear to auscultation bilaterally. No accessory  muscle usage.  CARDIAC: S1, S2 normal. Regular rate and rhythm. Positive murmur. Pacemaker site is intact. GASTROINTESTINAL: Soft. Bowel sounds are positive in all 4 quadrants. Positive epigastric discomfort, but no masses felt. No rebound tenderness. NEUROLOGIC: Awake, alert, and oriented x 3. Cranial nerves II through XII are grossly intact. Motor and sensory are intact. Reflexes are 2+.  EXTREMITIES: No edema. No cyanosis. No clubbing.  SKIN: Warm to touch. Normal turgor. No rashes. No lesions.  MUSCULOSKELETAL: No joint effusion, tenderness, erythema.  PSYCHIATRIC: Normal mood and affect.  LABORATORY AND IMAGING STUDIES: CAT scan of the abdomen and pelvis with contrast: Choledocholithiasis causing biliary obstruction status post cholecystectomy. Mild to moderate-size left-sided pleural effusion, small right-sided pleural effusion, colonic diverticulosis, bilateral cardiac enlargement, and mild bilateral atelectasis. Chest x-ray, portable: Stable cardiomegaly; interval patchy atelectasis or pneumonia at both lung bases, greater on the left side. A 12-lead EKG has revealed a paced rhythm. LFTs: Total protein 6.3, albumin 2.7, bilirubin total 2.2. Alkaline phosphatase is elevated at 209, AST 143, ALT 65. Troponin less than 0.03. WBC 7.9, hemoglobin 11.1, hematocrit 34.6, platelets are 167,000. Urinalysis: Yellow in color, clear in appearance. Nitrites and leukocyte esterase are negative. Glucose 112, BUN 20, creatinine 1.53. Sodium, potassium, and chloride are normal. CO2 normal. GFR 45. Anion gap, serum osmolality, calcium, and lipase are normal.   ASSESSMENT AND PLAN: A 81 year old Caucasian male who came into the Emergency Department with a chief complaint of low-grade fever, epigastric abdominal pain associated with nausea and vomiting. The patient's temperature was 100.2 in the Emergency Department. He is jaundiced, and CAT scan of the abdomen has revealed choledocholithiasis status post  cholecystectomy in the past.  1.  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 The patient has choledocholithiasis on CAT scan of the abdomen. Will admit him to medical-surgical floor. Will put him on clear liquid diet, provide intravenous fluids. Gastrointestinal prophylaxis will be provided. The patient will be on intravenous antibiotics Levaquin and Flagyl. Blood cultures were ordered in the Emergency Department. Gastroenterology is consulted. Will provide him pain management with morphine, as-needed basis. Will check a.m. labs, including liver function tests.   2.  Choledocholithiasis with acute cholangitis. Gastroenterology consult is placed for possible endoscopic retrograde cholangiopancreatography.  3.  Acute kidney injury secondary to intractable nausea and vomiting, probably prerenal. Will provide intravenous fluids and check a.m. labs. Avoid nephrotoxins.  4.  Hypertension. The patient is currently n.p.o. We will hold off on his medications. We will continue intravenous fluids, and if necessary, we will provide intravenous antihypertensive.  5.  History of coronary artery disease status post coronary artery bypass graft. Currently, the patient is n.p.o. Denies any chest pain at this time.  6.  History of benign prostatic hypertrophy. Hold off p.o. medications.  7.  History of permanent pacemaker for third-degree heart block. The patient is in paced rhythm.  8.  We will provide gastrointestinal prophylaxis and deep vein thrombosis prophylaxis with Pepcid intravenous and heparin subcutaneous.  CODE STATUS: He is do not resuscitate. Son is the medical power-of-attorney.  Plan of care discussed in detail with the patient and his son at bedside. They both verbalized understanding of the plan.  TOTAL TIME SPENT: 50 minutes.    ____________________________ Ramonita LabAruna Neira Bentsen, MD ag:ST D: 04/16/2014 22:02:42 ET T: 04/16/2014 22:44:46 ET JOB#: [id number]  cc: Ramonita LabAruna  Tyhir Schwan, MD, <Dictator> Stann Mainlandavid P. Sampson GoonFitzgerald, MD Deanna ArtisARUNA Amado CoeGOURU  MD ELECTRONICALLY SIGNED 05/05/2014 16:48

## 2014-09-10 NOTE — Consult Note (Signed)
Brief Consult Note: Diagnosis: Painful right total hip.   Patient was seen by consultant.   Recommend further assessment or treatment.   Orders entered.   Comments: 81 year old male admitted for severe gastrointestinal bleeding compains of right hip pain.  No recent falls.  Had right total hip replacement years ago by Dr [facility].  Had fracture around proximal femur 2013 which healed.  X-rays show gradual loosening of components.    Exam; Alert, oriented.  circulation/sensation/motor function good right leg.  Decreased range of motion of right hip with pain. Flex 60*/ext rot 30*/ internal rotation 10*.  Able to straight leg raise.  Left hip range of motion good.   X-rays:  Bilateral total hip replacement.  Right appears to have loosening of acetabulum and possibly femoral component.  No acute changes.  Left hip with poly wear.    Imp:  Painful right total hip replacement secondary to loosening  Rx:  Given his current situation and age he is not a surgical candidate.          Physical Therapy to mobilze touch down weight bearing on right         kpad to hip        analgesics as needed.          follow-up with Dr Gavin [facility] as out patient.  Electronic Signatures: Valinda HoarMiller, Ashir Kunz E (MD)  (Signed 30-May-15 16:46)  Authored: Brief Consult Note   Last Updated: 30-May-15 16:46 by Valinda HoarMiller, Leandra Vanderweele E (MD)

## 2014-09-10 NOTE — H&P (Signed)
PATIENT NAME:  [name] Snyder, [name] Snyder MR#:  [id number] DATE OF BIRTH:  1915-07-01  DATE OF ADMISSION:  10/14/2013  PCP: Dr Clydie Braun  HISTORY OF PRESENT ILLNESS: The patient is a 81 year old male with a known history of hypertension, gastritis and BPH who is being admitted for bright red blood per rectum. The patient started having rectal bleed day before yesterday. He also had another episode yesterday. This morning it was very small amount, but was feeling dizzy and decided to come to the Emergency Department. While in the ED, he was found to have hemoglobin of 7.5 and is being admitted for further evaluation and management.   PAST MEDICAL HISTORY: 1.  Hypertension.  2.  Arthritis. 3.  Gastritis.  4.  Moderate aortic stenosis.  5.  BPH status post TURP.  6.  Complete heart block and dual-chamber pacemaker. 7.  Valvular cardiopathy with valve replacement.  8.  Diverticulosis with history of GI bleed in February of 2010.  9.  Glucose intolerance.  PAST SURGICAL HISTORY: 1.  CABG x2, followed by Dr. Darrold Junker. 2.  Previous aortic valve replacement.  3.  Dual chamber pacemaker. 4.  Hip replacement x2.  5.  TURP in 1983.   HOME MEDICATIONS:  1.  Coreg 25 mg p.o. b.i.d. 2.  Omeprazole 20 mg p.o. b.i.d.  3.  Ramipril 5 mg p.o. daily. 4.  Simvastatin 20 mg p.o. daily.  5.  Citalopram 20 mg p.o. daily. 6.  Lasix 40 mg p.o. daily.   ALLERGIES: No known drug allergies.   FAMILY HISTORY: Negative for any cardiovascular disease. Both parents died of old age in 26s and 61s.   SOCIAL HISTORY: Nonsmoker. No alcohol. He lives with his wife at home. Son recently moved in to help take care of his parents.   REVIEW OF SYSTEMS: CONSTITUTIONAL: No fever, fatigue, weakness.  EYES: No blurred or double vision.  ENT: No tinnitus, ear pain.  RESPIRATORY: No cough, wheezing, hemoptysis.  CARDIOVASCULAR: No chest pain, orthopnea, edema.  GASTROINTESTINAL: No nausea, vomiting, diarrhea. Positive for  rectal bleed. GENITOURINARY: No dysuria or hematuria. ENDOCRINE: No polyuria or nocturia. HEMATOLOGY: Positive for anemia. No easy bruising. Positive for rectal bleed. SKIN: No rash or lesion.  MUSCULOSKELETAL: Positive for arthritis and recent hip fracture which was fixed. No cramps or gout. He does have limited activity secondary to his recent hip surgery. NEUROLOGIC: No tingling, numbness, weakness. PSYCHIATRY: No history of anxiety or depression.  PHYSICAL EXAMINATION: VITAL SIGNS: Temperature 97.6, heart rate 67 per minute, respirations 20 per minute, blood pressure 92/47 mmHg, and he was saturating 100% on room air. GENERAL: The patient is a 81 year old white male lying in the bed comfortably without any acute distress.  EYES: Pupils are equal, round, and reactive to light and accommodation. No scleral icterus. Extraocular muscles intact.  HENT: Head atraumatic, normocephalic. Oropharynx and nasopharynx clear.  NECK: Supple. No jugular venous distention. No thyromegaly or tenderness.   LUNGS: Clear to auscultation bilaterally. No wheezing, rales, rhonchi or crepitation.  CARDIOVASCULAR: S1, S2 normal. No murmurs, rubs or gallops.  ABDOMEN: Soft, nontender, nondistended. Bowel sounds present. No organomegaly or mass.  EXTREMITIES: No pedal edema, cyanosis or clubbing.  NEUROLOGIC: Cranial nerves II through XII intact. Muscle strength 5/5 in all extremities. Sensation intact.  PSYCHIATRIC: Alert and oriented x3.  SKIN: No rash or lesion.  MUSCULOSKELETAL: No joint effusion.   DIAGNOSTIC DATA: Laboratory panel: Normal BMP, except BUN of 34, creatinine 1.84. Normal liver function tests, except alkaline phosphatase 137  and AST 56. CBC within normal limits, except white count 6.6, hemoglobin 7.5, hematocrit 23.1, platelets 176,000. PT 14.9. INR 1.2. PTT 28.5.   IMPRESSION AND PLAN: 1.  Rectal bleed, likely from diverticular in nature. We will check hemoglobin and hematocrit q. 6, consult  gastroenterology, monitor blood pressure. At this time doubt any intervention needed. Usually self-controlled for the most part. We will hold any aspirin or any anticoagulants at this time. 2.  Acute renal failure, likely prerenal in nature. We will hydrate with IV fluids and monitor. 3.  Hypotension with a history of hypertension. We will hold off his home blood pressure medication. Monitor his blood pressure.  4.  History of gastritis. He will be on Protonix.   TOTAL TIME TAKING CARE OF THIS PATIENT: 55 minutes.  ____________________________ Ellamae SiaVipul S. Sherryll BurgerShah, MD vss:sb D: 10/14/2013 14:27:55 ET T: 10/14/2013 14:48:51 ET JOB#: [id number]  cc: Lometa Riggin S. Sherryll BurgerShah, MD, <Dictator> Stann Mainlandavid P. Sampson GoonFitzgerald, MD Ellamae SiaVIPUL S [facility] MD ELECTRONICALLY SIGNED 10/15/2013 10:39

## 2014-09-10 NOTE — Consult Note (Signed)
PATIENT NAME:  [name] Snyder, [name] Snyder MR#:  [id number] DATE OF BIRTH:  04-25-1916  DATE OF CONSULTATION:  10/14/2013  REFERRING PHYSICIAN:  Dr. Manuella Ghazi CONSULTING PHYSICIAN:  Corky Sox. Zettie Pho, PA-C and Verdie Shire, MD  REASON FOR CONSULTATION: Rectal bleeding.   HISTORY OF PRESENT ILLNESS: This is a pleasant 81 year old gentleman, who is accompanied by his wife, who presented to the Emergency Department due to ongoing bright red blood per rectum beginning on 10/12/2013. When speaking with the patient, he started noticing blood with the passage of stool. He describes the color as being dark black with accompanying bright red blood. There is no associated abdominal pain or rectal pain. There is no nausea, vomiting, indigestion, dysphagia or heartburn. There is no abdominal pain or trouble with his appetite. He has been eating well without any discomfort. He was feeling very slightly dizzy, and upon admission, his hemoglobin was found to be 7.5. He was admitted and is currently being transfused 1 unit of packed red blood cells. The patient's last colonoscopy, it appears, was in 2007 by Dr. Sonny Masters, notable for diverticulosis. He was in the hospital previously in 2010 for a diverticular bleed that did self-resolve with supportive care. The patient has not been having any trouble moving his bowels otherwise and denies any diarrhea or constipation. There is no chest pain or shortness of breath. No fever or chills. No recent travel or sick contacts.   PAST MEDICAL HISTORY: BPH, aortic stenosis, osteoarthritis, hypertension, history of gastritis, diverticulosis, valvular cardiomyopathy.   PAST SURGICAL HISTORY: Aortic valve replacement, CABG x 2, dual-chamber pacemaker placement, bilateral hip replacement, TURP.   ALLERGIES: No known drug allergies.   HOME MEDICATIONS: Ramipril, omeprazole, Coreg, simvastatin, citalopram, Lasix.   SOCIAL HISTORY: The patient denies any alcohol, tobacco or illicit drug use.   FAMILY  HISTORY: There is no known family history of GI malignancy, colon polyps or IBD.   REVIEW OF SYSTEMS: A ten-system review of systems review of systems was obtained on the patient. Pertinent positives are mentioned above and otherwise negative.   OBJECTIVE: VITAL SIGNS: Blood pressure 113/62, heart rate 64, respirations 16, temp 98.2, bedside pulse ox is 100%.  GENERAL: This is a pleasant 81 year old gentleman accompanied by his wife, in no acute distress. Alert and oriented x 3.  HEAD: Atraumatic, normocephalic.  NECK: Supple. No lymphadenopathy noted.  HEENT: Sclerae anicteric. Mucous membranes are moist.  LUNGS: Respirations are even and unlabored. Clear to auscultation bilateral anterior lung fields.  HEART: Regular rate and rhythm. S1, S2 noted. Pacemaker noted.  ABDOMEN: Soft, nontender, nondistended. Normoactive bowel sounds noted in all 4 quadrants. No guarding or rebound. No masses, hernias or organomegaly appreciated.  PSYCHIATRIC: Appropriate mood and affect.  RECTAL: Deferred.  EXTREMITIES: Negative for lower extremity edema, 2+ pulses noted in bilateral upper extremities.   LABORATORY DATA: White blood cells 6.6, hemoglobin 7.5, hematocrit 23.1, platelets 176, MCV 98. INR 1.2, PT 14.9. Sodium 139, potassium 3.8, BUN 34, creatinine 1.84, glucose 96. Bilirubin 0.6, alk phos 137, ALT 70, AST 56.   IMAGING: None thus far this hospitalization.   ASSESSMENT: 1.  Gastrointestinal bleed described as bright red blood, though in discussing with the patient further, he does report a black color as well.  2.  Anemia with an admitting hemoglobin of 7.5.  3.  Dizziness, likely secondary to symptomatic anemia.  4.  History of diverticulosis with a diverticular bleed in 2010.  5. History of valvular cardiomyopathy and aortic stenosis status post heart valve replacement  and dual chamber pacemaker.   PLAN: I have discussed this patient's case in detail with Dr. Verdie Shire, who is involved in the  development of the patient's plan of care. At the present time, the overall clinical picture is suggestive of likely a diverticular bleed and we do agree with checking his hemoglobin every 6 hours and being prepared to transfuse as necessary. Currently, he is finishing his first unit of a transfusion. Because the patient has reported some darker stools, we are requesting the nursing staff to keep an eye on any future bowel movements, so we can try and decipher the likelihood of this being melena. In the interim, we do agree with the patient being on Protonix therapy. If there is any evidence of active bleeding while inpatient, we would recommend obtaining a tagged red blood cell scan to try and identify the source. Otherwise, we do recommend continuing to monitor him closely and watch for signs of the bleeding to settle with supportive care. We will continue to monitor this patient closely throughout hospitalization and make further recommendations pending above and per clinical course. All questions were answered.   Thank you so much for this consultation and for allowing [place] to participate in the patient's plan of care.    ____________________________ Corky Sox. Sarah Zerby, PA-C kme:dmm D: 10/14/2013 17:00:49 ET T: 10/14/2013 19:10:02 ET JOB#: [id number]  cc: Corky Sox. Keera Altidor, PA-C, <Dictator> Hood PA ELECTRONICALLY SIGNED 10/15/2013 8:46

## 2014-09-10 NOTE — Consult Note (Signed)
Chief Complaint:  Subjective/Chief Complaint +seen for lower GI bleeding.  last bm last night about 7pm.  denies n, v or abdominal pain.  c/o generalized not feeling well, not specific.   VITAL SIGNS/ANCILLARY NOTES: **Vital Signs.:   30-May-15 13:29  Vital Signs Type Routine  Celsius 36.8  Temperature Source oral  Pulse Pulse 64  Respirations Respirations 18  Systolic BP Systolic BP 198  Diastolic BP (mmHg) Diastolic BP (mmHg) 71  Mean BP 113  Pulse Ox % Pulse Ox % 93  Pulse Ox Activity Level  At rest  Oxygen Delivery 2L   Brief Assessment:  Cardiac Regular   Respiratory clear BS   Gastrointestinal details normal Soft  Nontender  Nondistended  No masses palpable  Bowel sounds normal  No gaurding   Lab Results: Routine Coag:  28-May-15 10:54   INR 1.2 (INR reference interval applies to patients on anticoagulant therapy. A single INR therapeutic range for coumarins is not optimal for all indications; however, the suggested range for most indications is 2.0 - 3.0. Exceptions to the INR Reference Range may include: Prosthetic heart valves, acute myocardial infarction, prevention of myocardial infarction, and combinations of aspirin and anticoagulant. The need for a higher or lower target INR must be assessed individually. Reference: The Pharmacology and Management of the Vitamin K  antagonists: the seventh ACCP Conference on Antithrombotic and Thrombolytic Therapy. Chest.2004 Sept:126 (3suppl): [id number]. A HCT value >55% may artifactually increase the PT.  In one study,  the increase was an average of 25%. Reference:  "Effect on Routine and Special Coagulation Testing Values of Citrate Anticoagulant Adjustment in Patients with High HCT Values." American Journal of Clinical Pathology 2006;126:400-405.)  Routine Hem:  28-May-15 10:54   Hemoglobin (CBC)  7.5    20:01   Hemoglobin (CBC)  8.5 (Result(s) reported on 14 Oct 2013 at 08:18PM.)  29-May-15 02:10   Hemoglobin  (CBC)  7.5    10:20   Hemoglobin (CBC)  7.7 (Result(s) reported on 15 Oct 2013 at 11:09AM.)    16:05   Hemoglobin (CBC)  8.0 (Result(s) reported on 15 Oct 2013 at 04:34PM.)  30-May-15 05:40   Hemoglobin (CBC)  6.9 (Result(s) reported on 16 Oct 2013 at 06:35AM.)   Assessment/Plan:  Assessment/Plan:  Assessment 1) lower GI bleeding, hemodynamically stqable.  being tfx one unit prbc.  will recheck hgb afterward.  no family present.  if there is another episode of significant bleeding would consider a bleeding scan and if positive a vascular surger consult for consideration of microembolization.  Family not wanting colonoscopy.   Plan as above.   Electronic Signatures: Barnetta ChapelSkulskie, Tia Gelb (MD)  (Signed [id number]-May-15 15:39)  Authored: Chief Complaint, VITAL SIGNS/ANCILLARY NOTES, Brief Assessment, Lab Results, Assessment/Plan   Last Updated: 30-May-15 15:39 by Barnetta ChapelSkulskie, Verdell Dykman (MD)

## 2014-09-10 NOTE — Consult Note (Signed)
Chief Complaint:  Subjective/Chief Complaint Events of the weekend noted. No specific GI complaints. No GI bleeding  in more than 48hrs. Some mention of BC powder use in the past. Pt on IV protonix. Taking clears.   VITAL SIGNS/ANCILLARY NOTES: **Vital Signs.:   01-Jun-15 05:20  Vital Signs Type Routine  Temperature Temperature (F) 97.7  Celsius 36.5  Temperature Source oral  Pulse Pulse 63  Respirations Respirations 20  Systolic BP Systolic BP 160  Diastolic BP (mmHg) Diastolic BP (mmHg) 66  Mean BP 96  Pulse Ox % Pulse Ox % 98  Pulse Ox Activity Level  At rest  Oxygen Delivery 2L   Brief Assessment:  GEN well nourished   Cardiac Regular   Respiratory clear BS   Gastrointestinal Normal   Gastrointestinal details normal Soft   Lab Results: Routine Chem:  31-May-15 06:58   Glucose, Serum 95  BUN 18  Creatinine (comp) 1.22  Sodium, Serum 140  Potassium, Serum 3.9  Chloride, Serum  112  CO2, Serum 22  Calcium (Total), Serum  8.3  Anion Gap  6  Osmolality (calc) 281  eGFR (African American)  58  eGFR (Non-African American)  50 (eGFR values <33m/min/1.73 m2 may be an indication of chronic kidney disease (CKD). Calculated eGFR is useful in patients with stable renal function. The eGFR calculation will not be reliable in acutely ill patients when serum creatinine is changing rapidly. It is not useful in  patients on dialysis. The eGFR calculation may not be applicable to patients at the low and high extremes of body sizes, pregnant women, and vegetarians.)  Routine Hem:  31-May-15 06:58   Hemoglobin (CBC)  7.7 (Result(s) reported on 17 Oct 2013 at 07:39AM.)   Assessment/Plan:  Assessment/Plan:  Assessment GI bleeding, which has stopped.   Plan Advance diet. Ok to switch to po protonix daily. Consider Fe supplements to raise hgb.NO need for bleeding scan or angiography unless there is active bleeding. [facility] Contact me if there are  questions. thanks.   Electronic Signatures: OVerdie Shire(MD)  (Signed 01-Jun-15 12:42)  Authored: Chief Complaint, VITAL SIGNS/ANCILLARY NOTES, Brief Assessment, Lab Results, Assessment/Plan   Last Updated: 01-Jun-15 12:42 by OVerdie Shire(MD)

## 2014-09-11 NOTE — Discharge Summary (Signed)
PATIENT NAME:  Snyder, Snyder MR#:  [id number] DATE OF BIRTH:  13-Jun-1915  DATE OF ADMISSION:  06/14/2011 DATE OF DISCHARGE:  06/17/2011  HISTORY/HOSPITAL COURSE: Mr. Allston is a 81 year old male with known ischemic valvular cardiomyopathy with a pacemaker who was admitted through the Emergency Room with some vague history of chest pain, maybe abdominal pain, and some nausea. He had recently been undergoing active GI evaluation by Dr. Gustavo Lah with some suggestion of obstipation, constipation syndrome. In the Emergency Room, his renal function was significantly worse than one had been done a week earlier in preparation for his CAT scan of his abdomen. He was admitted to the hospital for further evaluation and stabilization of his acute renal failure. His diuretic was discontinued and he was put on IV fluids. He remained relatively asymptomatic. His telemetry was paced. Over the course of 72 hours his renal function returned to baseline. He remained asymptomatic. He was able to ambulate but physical therapy felt that his balance was bad enough that he would benefit from some continued outpatient physical therapy. He has known history of significant degenerative arthritis. He did have a bowel movement in the hospital requiring MiraLax for stimulation.   His blood pressure ranged from 126/69 to 118/84. His ACE inhibitor had been held during this entire hospitalization.   The patient had an echocardiogram during this hospitalization which showed the left ventricle with moderate dilatation, no thrombosis, and fraction was 35 to 45%. There was moderate anterior wall hypokinesis, moderate apical wall hypokinesis, and global hypokinesis of the left ventricle. The right ventricle showed moderate to severe dilatation and mild valvular aortic stenosis, as interpreted by Dr. Clayborn Bigness.   At the time of discharge, his sugar was 98, BUN 21, creatinine 1.29, sodium 140, potassium 4.7, and chloride 109. EGFR was 55. His  last hemoglobin was 12.1. White count was 4000.   Vitals at discharge included a blood pressure of 118/64, afebrile, pulse 60 and regular, and pulse oximetry 97%.   DISCHARGE DIAGNOSES:  1. Acute renal failure probably secondary to dehydration.  2. Ischemic valvular cardiomyopathy, moderately severe by Echo.  3. Hypertensive cardiovascular disease by history. 4. Age-related mild dementia. 5. Hyperlipidemia. 6. Reflux syndrome. 7. Obstipation syndrome.   DISCHARGE MEDICATIONS:  1. Ramipril was decreased to 5 mg daily. At the time of admission, he had been on 20 mg daily. 2. Amlodipine was decreased to 2.5 mg daily from 5 mg he had been on prior to admission.  3. Furosemide was held. 4. Potassium supplement was held.   He was continued on other home medications which include the following: 1. Aspirin 81 mg daily.  2. Carvedilol 25 mg twice a day. 3. Omeprazole 20 mg twice a day. 4. Simvastatin 40 mg daily.  5. Potassium 10 mEq was held.   DISCHARGE INSTRUCTIONS/FOLLOWUP: The patient will have home health and home physical therapy. Home health is asked to draw his labs, to be repeated in one week, of a MET-B and CBC. He will be seen in the office by me, his primary care, in approximately 7 days to 10 days for follow-up and probable medication further adjustment.   PROGNOSIS: His overall prognosis remains guarded. ____________________________ Dianah Field Mable Fill, MD dcc:slb D: 06/17/2011 08:03:50 ET     T: 06/17/2011 11:00:15 ET       JOB#: [id number] cc: Dianah Field. Mable Fill, MD, <Dictator> Isaias Cowman, MD Tawni Millers MD ELECTRONICALLY SIGNED 06/26/2011 7:26

## 2014-09-11 NOTE — H&P (Signed)
PATIENT NAME:  [name] ChildsCLAYTON, [name] E MR#:  [id number] DATE OF BIRTH:  [birth year]  DATE OF ADMISSION:  06/14/2011  PRIMARY CARE PHYSICIAN: Roe Coombson C. Candelaria Stagershaplin, MD    CARDIOLOGIST: Marcina MillardAlexander Paraschos, MD    CHIEF COMPLAINT: I thought I was having a heart attack.   HISTORY OF PRESENT ILLNESS: The patient is a 81 year old man who states that he thinks he was having a heart attack. He is a very difficult historian. He describes the chest pain in his left chest lasting for a few hours not so bad, but he cannot describe the pain. Nothing made it better or worse. He was reading the paper at the time. No diaphoresis. No shortness of breath. Occasional nausea. He also stated that his eyes would not focus this morning. He has been having abdominal pain, and he said that he saw Dr. Marva PandaSkulskie yesterday and was given a prescription, not clear what that is; but he did have a CT scan of the abdomen and pelvis without contrast done on January 18th which did not show too much at that time. In the Emergency Room, he was found to be in acute renal failure with a creatinine of 2.36, with a recent creatinine on January 18th of 1.12. The patient states that he has been eating okay even though he has been having abdominal pain, but the patient is on Lasix and Altace. Hospitalist Services were contacted for further evaluation.   PAST MEDICAL HISTORY: (As per [facility])  1. Coronary artery disease.  2. Hypertension.  3. Diabetes.  4. Atrial fibrillation.  5. Pernicious anemia.  6. Vitamin D deficiency.  7. Moderate aortic stenosis.  8. Benign prostatic hypertrophy.  9. Gastritis.   PAST SURGICAL HISTORY:  1. Right hip replacement x2.  2. Pacemaker.  3. 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  4. TURP.   ALLERGIES: No known drug allergies.   MEDICATIONS: (As per prescription writer)  1. Coreg 25 mg b.i.d.  2. Omeprazole 20 mg b.i.d.  3. K-Dur 10 mEq daily.  4. Lasix 20 mg daily.  5. Zocor 40 mg at bedtime.   6. Altace 20 mg daily.  7. Norvasc 5 mg daily.  8. Aspirin 81 mg daily.  9. Acetaminophen.  10. The only other medicine in the [facility] record is vitamin D 1000 international units daily.   SOCIAL HISTORY: No smoking. No alcohol. No drug use. He lives with his wife. He used to be a Designer, fashion/clothingservice station worker in the past.   FAMILY HISTORY: Father died at 5687 of old age. Mother died at 6886 of old age.   REVIEW OF SYSTEMS: CONSTITUTIONAL: Positive for chills. No fever, no sweats. No weight gain, no weight loss. No weakness or fatigue. EYES: He could not focus his eyes this a.m. He does wear reading glasses. EARS, NOSE, MOUTH, AND THROAT: No hearing loss. No sore throat. No difficulty swallowing. CARDIOVASCULAR: Positive for chest pain. No palpitations. RESPIRATORY: No shortness of breath. No coughing, no sputum. No hemoptysis. GASTROINTESTINAL: Occasional nausea. Positive for abdominal pain in the upper abdomen. Positive for constipation. No bright red blood per rectum. No melena. GENITOURINARY: No burning on urination. No hematuria. MUSCULOSKELETAL: No joint pain. INTEGUMENT: No rashes or eruptions. NEUROLOGIC: No fainting or blackouts. PSYCHIATRIC: No anxiety or depression. ENDOCRINE: No thyroid problems. HEMATOLOGIC/LYMPHATIC: History of anemia in the past.   PHYSICAL EXAMINATION:  VITAL SIGNS: Temperature 97.6, pulse 60, respirations 16, blood pressure 150/68, pulse oximetry 99%.   GENERAL: No respiratory distress.  HEENT: Eyes: Conjunctivae and lids normal. Pupils are equal, round, and reactive to light. Extraocular muscles are intact. No nystagmus. Ears, nose, mouth, and throat: Nasal mucosa no erythema. Throat no erythema. No exudate seen. Lips and gums no lesions.   NECK: No JVD. No bruits. No lymphadenopathy. No thyromegaly. No thyroid nodules palpated.   RESPIRATORY: Lungs are clear to auscultation. No use of accessory muscles to breathe. No rhonchi, rales, or wheeze heard.    CARDIOVASCULAR: S1, S2 normal, positive 2 out of 6 systolic ejection murmur. Carotid upstroke 2+ bilaterally. No bruits.   EXTREMITIES: Dorsalis pedis pulses 1+ bilaterally. No edema of the lower extremity.   ABDOMEN: Soft. The patient really does not have much tenderness to palpation. No organomegaly/splenomegaly. Normoactive bowel sounds.   LYMPHATIC: No lymph nodes in the neck.   MUSCULOSKELETAL: No clubbing, edema, or cyanosis.   SKIN: No ulcers seen.   NEUROLOGICAL: Cranial nerves II through XII are grossly intact. Deep tendon reflexes are 2+ bilateral lower extremities.   PSYCHIATRIC: The patient is oriented to person and place.   LABORATORY, DIAGNOSTIC AND RADIOLOGICAL DATA:  EKG: Paced at 61 beats per minute.  Chest x-ray: Right lung base fibrosis, CABG, prosthetic cardiac valve present, pacemaker present.  White blood cell count 4.7, hemoglobin and hematocrit 12.4 and 37.1, platelet count of 205. Glucose 107, BUN 36, creatinine 2.36, sodium 139, potassium 5.0, chloride 104, CO2 22, calcium 9.2 GFR 28. Troponin negative.  Urinalysis negative.  Lactic acid done on the 18th was negative   ASSESSMENT AND PLAN:  1. Acute renal failure: Unclear etiology at this point. We will give gentle IV fluid hydration, hold Altace and Lasix and potassium at this point. Get a renal sonogram. Continue to trend creatinines on a daily basis.  2. Chest pain: We will admit to off-unit telemetry, get serial cardiac enzymes, obtain an echocardiogram. Aspirin and Coreg at this point with medical management.  3. Abdominal pain: Unclear etiology. A CT scan without contrast was unrevealing. I will get an abdominal ultrasound. I will put him on IV Protonix. This could be ischemic colitis, too.  I will keep that in the differential, unable to do any testing for that with the acute renal failure.  4. Hyperlipidemia: Continue Zocor. Check a lipid profile in the a.m.  5. Hypertension: Blood pressure is a  little borderline right now. Continue Coreg and Norvasc. Hold the Altace and Lasix at this point.  6. History of diabetes: Not on any medications for this. We will check fingersticks and continue to monitor.   CODE STATUS:  The patient is a FULL CODE, but I am not sure if he understood the question.   TIME SPENT ON ADMISSION: 55 minutes.    ____________________________ Herschell Dimes. Renae Gloss, MD [id number] D: 06/14/2011 17:45:45 ET T: 06/14/2011 18:00:16 ET JOB#: [id number]  cc: Herschell Dimes. Renae Gloss, MD, <Dictator> Don C. Candelaria Stagers, MD Salley Scarlet MD ELECTRONICALLY SIGNED 06/28/2011 16:16

## 2014-09-12 LAB — SURGICAL PATHOLOGY

## 2014-11-13 ENCOUNTER — Emergency Department: Payer: Medicare Other

## 2014-11-13 ENCOUNTER — Encounter: Payer: Self-pay | Admitting: Emergency Medicine

## 2014-11-13 ENCOUNTER — Emergency Department
Admission: EM | Admit: 2014-11-13 | Discharge: 2014-11-13 | Disposition: A | Discharge status: Home / Self Care | Payer: Medicare Other | Attending: Emergency Medicine | Admitting: Emergency Medicine

## 2014-11-13 DIAGNOSIS — S0990XA Unspecified injury of head, initial encounter: Secondary | ICD-10-CM | POA: Diagnosis present

## 2014-11-13 DIAGNOSIS — T148XXA Other injury of unspecified body region, initial encounter: Secondary | ICD-10-CM

## 2014-11-13 DIAGNOSIS — Y9389 Activity, other specified: Secondary | ICD-10-CM | POA: Diagnosis not present

## 2014-11-13 DIAGNOSIS — I10 Essential (primary) hypertension: Secondary | ICD-10-CM | POA: Diagnosis not present

## 2014-11-13 DIAGNOSIS — Y998 Other external cause status: Secondary | ICD-10-CM | POA: Insufficient documentation

## 2014-11-13 DIAGNOSIS — S0083XA Contusion of other part of head, initial encounter: Secondary | ICD-10-CM | POA: Diagnosis not present

## 2014-11-13 DIAGNOSIS — W01198A Fall on same level from slipping, tripping and stumbling with subsequent striking against other object, initial encounter: Secondary | ICD-10-CM | POA: Insufficient documentation

## 2014-11-13 DIAGNOSIS — Y9289 Other specified places as the place of occurrence of the external cause: Secondary | ICD-10-CM | POA: Diagnosis not present

## 2014-11-13 DIAGNOSIS — W19XXXA Unspecified fall, initial encounter: Secondary | ICD-10-CM

## 2014-11-13 HISTORY — DX: Essential (primary) hypertension: I10

## 2014-11-13 NOTE — ED Provider Notes (Signed)
[facility] Emergency Department Provider Note    ____________________________________________  Time seen: 1920  I have reviewed the triage vital signs and the nursing notes.   HISTORY  Chief Complaint Head Injury   History limited by: Not Limited   HPI [name] Snyder is a 81 y.o. male who presents to the emergency department after a mechanical fall. The patient was getting up from a chair when he tripped on the rug. The family thinks that he hit his head on the hearth. He did not have a LOC. Not complaining of any pain. Not on blood thinners. No change in behavior, vomiting, blurred vision. Denies any pain in his hands or wrists.      Past Medical History  Diagnosis Date  . Hypertension     There are no active problems to display for this patient.   Past Surgical History  Procedure Laterality Date  . Cholecystectomy    . Pacemaker insertion      No current outpatient prescriptions on file.  Allergies Review of patient's allergies indicates no known allergies.  No family history on file.  Social History History  Substance Use Topics  . Smoking status: Never Smoker   . Smokeless tobacco: Not on file  . Alcohol Use: No    Review of Systems  Constitutional: Negative for fever. Cardiovascular: Negative for chest pain. Respiratory: Negative for shortness of breath. Gastrointestinal: Negative for abdominal pain, vomiting and diarrhea. Genitourinary: Negative for dysuria. Musculoskeletal: Negative for back pain. Skin: Negative for rash. Neurological: Negative for headaches, focal weakness or numbness.   10-point ROS otherwise negative.  ____________________________________________   PHYSICAL EXAM:  VITAL SIGNS: ED Triage Vitals  Enc Vitals Group     BP 11/13/14 1849 168/78 mmHg     Pulse Rate 11/13/14 1848 63     Resp 11/13/14 1848 16     Temp 11/13/14 1848 98.1 F (36.7 C)     Temp Source 11/13/14 1848 Oral     SpO2  11/13/14 1848 98 %     Weight 11/13/14 1848 145 lb (65.772 kg)     Height 11/13/14 1848  (1.676 m)     Head Cir --      Peak Flow --      Pain Score 11/13/14 1849 3   Constitutional: Alert and oriented. Well appearing and in no distress. Eyes: Conjunctivae are normal. PERRL. Normal extraocular movements. ENT   Head: Normocephalic. Hematoma to right forehead with superficial hemostatic laceration.   Nose: No congestion/rhinnorhea.   Mouth/Throat: Mucous membranes are moist.   Neck: No stridor. Hematological/Lymphatic/Immunilogical: No cervical lymphadenopathy. Cardiovascular: Normal rate. Respiratory: Normal respiratory effort without tachypnea nor retractions.  Gastrointestinal: Soft and nontender. No distention. There is no CVA tenderness. Genitourinary: Deferred Musculoskeletal: Normal range of motion in all extremities. No joint effusions.  No lower extremity tenderness nor edema. Neurologic:  Normal speech and language. No gross focal neurologic deficits are appreciated. Speech is normal.  Skin:  Skin is warm. Hematoma to right forehead with small superficial laceration. Psychiatric: Mood and affect are normal. Speech and behavior are normal. Patient exhibits appropriate insight and judgment.  ____________________________________________    LABS (pertinent positives/negatives)  None  ____________________________________________   EKG  None  ____________________________________________    RADIOLOGY  CT head/C spine  IMPRESSION: 1. No acute intracranial abnormality. Progressive atrophy. 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 Small right frontal scalp hematoma. 2. Spinal cord compression at C1-2 due to chronic subluxation of C1 on C2, increased by  0.5 mm since the prior study of 12/29/2012.  ____________________________________________   PROCEDURES  Procedure(s) performed: None  Critical Care performed:  No  ____________________________________________   INITIAL IMPRESSION / ASSESSMENT AND PLAN / ED COURSE  Pertinent labs & imaging results that were available during my care of the patient were reviewed by me and considered in my medical decision making (see chart for details).  Patient presents after a fall with head injury. CT head and cervical spine negative. Patient without any other concerning traumatic injuries. Given that it was a mechanical fall at all feel any blood work is required at this time. Will discharge home.  ____________________________________________   FINAL CLINICAL IMPRESSION(S) / ED DIAGNOSES  Hematoma Charlann Boxer, MD 11/13/14 2025

## 2014-11-13 NOTE — ED Notes (Signed)
Pt from home after mechanical fall, family reports pt was getting up from chair, tripped on rug and hit right side of head on fireplace hearth. Pt denies LOC, denies any vision changes. Family reports pt is at baseline. Family denies any blood thinners.

## 2014-11-13 NOTE — Discharge Instructions (Signed)
Please seek medical attention for any high fevers, chest pain, shortness of breath, change in behavior, persistent vomiting, bloody stool or any other new or concerning symptoms.  Fall Prevention and Home Safety Falls cause injuries and can affect all age groups. It is possible to prevent falls.  HOW TO PREVENT FALLS  Wear shoes with rubber soles that do not have an opening for your toes.  Keep the inside and outside of your house well lit.  Use night lights throughout your home.  Remove clutter from floors.  Clean up floor spills.  Remove throw rugs or fasten them to the floor with carpet tape.  Do not place electrical cords across pathways.  Put grab bars by your tub, shower, and toilet. Do not use towel bars as grab bars.  Put handrails on both sides of the stairway. Fix loose handrails.  Do not climb on stools or stepladders, if possible.  Do not wax your floors.  Repair uneven or unsafe sidewalks, walkways, or stairs.  Keep items you use a lot within reach.  Be aware of pets.  Keep emergency numbers next to the telephone.  Put smoke detectors in your home and near bedrooms. Ask your doctor what other things you can do to prevent falls. Document Released: 03/02/2009 Document Revised: 11/05/2011 Document Reviewed: 08/06/2011 [facility] Patient Information 2015 [place], [place]. This information is not intended to replace advice given to you by your health care provider. Make sure you discuss any questions you have with your health care provider.  Hematoma A hematoma is a collection of blood under the skin, in an organ, in a body space, in a joint space, or in other tissue. The blood can clot to form a lump that you can see and feel. The lump is often firm and may sometimes become sore and tender. Most hematomas get better in a few days to weeks. However, some hematomas may be serious and require medical care. Hematomas can range in size from very small to very large. CAUSES   A hematoma can be caused by a blunt or penetrating injury. It can also be caused by spontaneous leakage from a blood vessel under the skin. Spontaneous leakage from a blood vessel is more likely to occur in older people, especially those taking blood thinners. Sometimes, a hematoma can develop after certain medical procedures. SIGNS AND SYMPTOMS   A firm lump on the body.  Possible pain and tenderness in the area.  Bruising.Blue, dark blue, purple-red, or yellowish skin may appear at the site of the hematoma if the hematoma is close to the surface of the skin. For hematomas in deeper tissues or body spaces, the signs and symptoms may be subtle. For example, an intra-abdominal hematoma may cause abdominal pain, weakness, fainting, and shortness of breath. An intracranial hematoma may cause a headache or symptoms such as weakness, trouble speaking, or a change in consciousness. DIAGNOSIS  A hematoma can usually be diagnosed based on your medical history and a physical exam. Imaging tests may be needed if your health care provider suspects a hematoma in deeper tissues or body spaces, such as the abdomen, head, or chest. These tests may include ultrasonography or a CT scan.  TREATMENT  Hematomas usually go away on their own over time. Rarely does the blood need to be drained out of the body. Large hematomas or those that may affect vital organs will sometimes need surgical drainage or monitoring. HOME CARE INSTRUCTIONS   Apply ice to the injured area:  Put ice in a plastic bag.   Place a towel between your skin and the bag.   Leave the ice on for 20 minutes, 2-3 times a day for the first 1 to 2 days.   After the first 2 days, switch to using warm compresses on the hematoma.   Elevate the injured area to help decrease pain and swelling. Wrapping the area with an elastic bandage may also be helpful. Compression helps to reduce swelling and promotes shrinking of the hematoma. Make sure the  bandage is not wrapped too tight.   If your hematoma is on a lower extremity and is painful, crutches may be helpful for a couple days.   Only take over-the-counter or prescription medicines as directed by your health care provider. SEEK IMMEDIATE MEDICAL CARE IF:   You have increasing pain, or your pain is not controlled with medicine.   You have a fever.   You have worsening swelling or discoloration.   Your skin over the hematoma breaks or starts bleeding.   Your hematoma is in your chest or abdomen and you have weakness, shortness of breath, or a change in consciousness.  Your hematoma is on your scalp (caused by a fall or injury) and you have a worsening headache or a change in alertness or consciousness. MAKE SURE YOU:   Understand these instructions.  Will watch your condition.  Will get help right away if you are not doing well or get worse. Document Released: 12/19/2003 Document Revised: 01/06/2013 Document Reviewed: 10/14/2012 [facility] Patient Information 2015 [place], [place]. This information is not intended to replace advice given to you by your health care provider. Make sure you discuss any questions you have with your health care provider.

## 2015-02-28 ENCOUNTER — Emergency Department: Payer: Medicare Other

## 2015-02-28 ENCOUNTER — Encounter: Payer: Self-pay | Admitting: Emergency Medicine

## 2015-02-28 ENCOUNTER — Emergency Department
Admission: EM | Admit: 2015-02-28 | Discharge: 2015-02-28 | Disposition: A | Discharge status: Home / Self Care | Payer: Medicare Other | Attending: Emergency Medicine | Admitting: Emergency Medicine

## 2015-02-28 DIAGNOSIS — F0390 Unspecified dementia without behavioral disturbance: Secondary | ICD-10-CM | POA: Diagnosis not present

## 2015-02-28 DIAGNOSIS — I10 Essential (primary) hypertension: Secondary | ICD-10-CM | POA: Diagnosis not present

## 2015-02-28 DIAGNOSIS — R42 Dizziness and giddiness: Secondary | ICD-10-CM | POA: Insufficient documentation

## 2015-02-28 DIAGNOSIS — Z79899 Other long term (current) drug therapy: Secondary | ICD-10-CM | POA: Insufficient documentation

## 2015-02-28 DIAGNOSIS — R4182 Altered mental status, unspecified: Secondary | ICD-10-CM | POA: Insufficient documentation

## 2015-02-28 DIAGNOSIS — R531 Weakness: Secondary | ICD-10-CM

## 2015-02-28 LAB — COMPREHENSIVE METABOLIC PANEL
ALT: 8 U/L — ABNORMAL LOW (ref 17–63)
ANION GAP: 10 (ref 5–15)
AST: 16 U/L (ref 15–41)
Albumin: 3.8 g/dL (ref 3.5–5.0)
Alkaline Phosphatase: 84 U/L (ref 38–126)
BILIRUBIN TOTAL: 0.6 mg/dL (ref 0.3–1.2)
BUN: 29 mg/dL — ABNORMAL HIGH (ref 6–20)
CO2: 26 mmol/L (ref 22–32)
Calcium: 9.6 mg/dL (ref 8.9–10.3)
Chloride: 103 mmol/L (ref 101–111)
Creatinine, Ser: 1.55 mg/dL — ABNORMAL HIGH (ref 0.61–1.24)
GFR calc Af Amer: 41 mL/min — ABNORMAL LOW (ref >60)
GFR calc non Af Amer: 36 mL/min — ABNORMAL LOW (ref >60)
GLUCOSE: 98 mg/dL (ref 65–99)
POTASSIUM: 4.1 mmol/L (ref 3.5–5.1)
SODIUM: 139 mmol/L (ref 135–145)
TOTAL PROTEIN: 7.6 g/dL (ref 6.5–8.1)

## 2015-02-28 LAB — CBC
HEMATOCRIT: 36.3 % — AB (ref 40.0–52.0)
HEMOGLOBIN: 11.9 g/dL — AB (ref 13.0–18.0)
MCH: 32.2 pg (ref 26.0–34.0)
MCHC: 32.7 g/dL (ref 32.0–36.0)
MCV: 98.3 fL (ref 80.0–100.0)
Platelets: 242 10*3/uL (ref 150–440)
RBC: 3.69 MIL/uL — AB (ref 4.40–5.90)
RDW: 15.3 % — ABNORMAL HIGH (ref 11.5–14.5)
WBC: 5.7 10*3/uL (ref 3.8–10.6)

## 2015-02-28 LAB — URINALYSIS COMPLETE WITH MICROSCOPIC (ARMC ONLY)
BACTERIA UA: NONE SEEN
Bilirubin Urine: NEGATIVE
Glucose, UA: NEGATIVE mg/dL
HGB URINE DIPSTICK: NEGATIVE
KETONES UR: NEGATIVE mg/dL
LEUKOCYTES UA: NEGATIVE
NITRITE: NEGATIVE
PH: 5 (ref 5.0–8.0)
PROTEIN: NEGATIVE mg/dL
RBC / HPF: NONE SEEN RBC/hpf (ref 0–5)
SPECIFIC GRAVITY, URINE: 1.011 (ref 1.005–1.030)
Squamous Epithelial / LPF: NONE SEEN

## 2015-02-28 LAB — GLUCOSE, CAPILLARY: Glucose-Capillary: 85 mg/dL (ref 65–99)

## 2015-02-28 NOTE — Discharge Instructions (Signed)
As we discussed, your workup today was reassuring.  Though we do not know exactly what is causing your symptoms, it appears that you have no emergent medical condition at this time are safe to go home and follow up as recommended in this paperwork.  Please return immediately to the Emergency Department if you develop any new or worsening symptoms that concern you.   Weakness Weakness is a lack of strength. It may be felt all over the body (generalized) or in one specific part of the body (focal). Some causes of weakness can be serious. You may need further medical evaluation, especially if you are elderly or you have a history of immunosuppression (such as chemotherapy or HIV), kidney disease, heart disease, or diabetes. CAUSES  Weakness can be caused by many different things, including:  Infection.  Physical exhaustion.  Internal bleeding or other blood loss that results in a lack of red blood cells (anemia).  Dehydration. This cause is more common in elderly people.  Side effects or electrolyte abnormalities from medicines, such as pain medicines or sedatives.  Emotional distress, anxiety, or depression.  Circulation problems, especially severe peripheral arterial disease.  Heart disease, such as rapid atrial fibrillation, bradycardia, or heart failure.  Nervous system disorders, such as Guillain-Barr syndrome, multiple sclerosis, or stroke. DIAGNOSIS  To find the cause of your weakness, your caregiver will take your history and perform a physical exam. Lab tests or X-rays may also be ordered, if needed. TREATMENT  Treatment of weakness depends on the cause of your symptoms and can vary greatly. HOME CARE INSTRUCTIONS   Rest as needed.  Eat a well-balanced diet.  Try to get some exercise every day.  Only take over-the-counter or prescription medicines as directed by your caregiver. SEEK MEDICAL CARE IF:   Your weakness seems to be getting worse or spreads to other parts  of your body.  You develop new aches or pains. SEEK IMMEDIATE MEDICAL CARE IF:   You cannot perform your normal daily activities, such as getting dressed and feeding yourself.  You cannot walk up and down stairs, or you feel exhausted when you do so.  You have shortness of breath or chest pain.  You have difficulty moving parts of your body.  You have weakness in only one area of the body or on only one side of the body.  You have a fever.  You have trouble speaking or swallowing.  You cannot control your bladder or bowel movements.  You have black or bloody vomit or stools. MAKE SURE YOU:  Understand these instructions.  Will watch your condition.  Will get help right away if you are not doing well or get worse.   This information is not intended to replace advice given to you by your health care provider. Make sure you discuss any questions you have with your health care provider.   Document Released: 05/06/2005 Document Revised: 11/05/2011 Document Reviewed: 07/05/2011 [employer] Interactive Patient Education [employer]

## 2015-02-28 NOTE — ED Provider Notes (Signed)
[facility] Emergency Department Provider Note  ____________________________________________  Time seen: Approximately 7:49 PM  I have reviewed the triage vital signs and the nursing notes.   HISTORY  Chief Complaint Altered Mental Status and Dizziness  Mild dementia  HPI [name] Snyder is a 81 y.o. male who is surprisingly healthy for his age who presents several days after having some weakness and altered mental status.it was initially thought that he was having acute altered mental status, but his son clarify that for the last 2 days (worse 2 days ago) the patient has seemed a little bit more confused than usual and had some dizziness/lightheadedness.  However, the patient states he feels fine today, the son agrees that he feels fine, but "just wanted to get him checked out".  He was not able to take him to his primary care doctor today.  The patient is ambulating without any difficulty using his walker as per usual.  He denies fever/chills, chest pain, shortness of breath, visual changes, lightheadedness, dizziness (although he did have some 2 days ago, specifically the lightheadedness/dizziness).   Past Medical History  Diagnosis Date  . Hypertension     There are no active problems to display for this patient.   Past Surgical History  Procedure Laterality Date  . Cholecystectomy    . Pacemaker insertion      Current Outpatient Rx  Name  Route  Sig  Dispense  Refill  . carvedilol (COREG) 25 MG tablet   Oral   Take 25 mg by mouth 2 (two) times daily with a meal.         . citalopram (CELEXA) 10 MG tablet   Oral   Take 10 mg by mouth 2 (two) times daily.         Marland Kitchen donepezil (ARICEPT) 10 MG tablet   Oral   Take 10 mg by mouth at bedtime.          . furosemide (LASIX) 40 MG tablet   Oral   Take 20 mg by mouth daily.          Marland Kitchen losartan (COZAAR) 25 MG tablet   Oral   Take 25 mg by mouth daily.         Marland Kitchen omeprazole (PRILOSEC) 40  MG capsule   Oral   Take 40 mg by mouth daily.         . vitamin B-12 (CYANOCOBALAMIN) 1000 MCG tablet   Oral   Take 1,000 mcg by mouth at bedtime.            Allergies Review of patient's allergies indicates no known allergies.  No family history on file.  Social History Social History  Substance Use Topics  . Smoking status: Never Smoker   . Smokeless tobacco: None  . Alcohol Use: No    Review of Systems Constitutional: No fever/chills Eyes: No visual changes. ENT: No sore throat. Cardiovascular: Denies chest pain. Respiratory: Denies shortness of breath. Gastrointestinal: No abdominal pain.  No nausea, no vomiting.  No diarrhea.  No constipation. Genitourinary: Negative for dysuria. Musculoskeletal: Negative for back pain. Skin: Negative for rash. Neurological: Negative for headaches, focal weakness or numbness.some generalized lightheadedness and dizziness 2 days ago with apparently some visual hallucinations as well.  10-point ROS otherwise negative.  ____________________________________________   PHYSICAL EXAM:  VITAL SIGNS: ED Triage Vitals  Enc Vitals Group     BP 02/28/15 1824 160/72 mmHg     Pulse Rate 02/28/15 1824 61  Resp 02/28/15 1824 18     Temp 02/28/15 1824 98.2 F (36.8 C)     Temp Source 02/28/15 1824 Oral     SpO2 02/28/15 1824 98 %     Weight 02/28/15 1824 164 lb (74.39 kg)     Height 02/28/15 1824 5\' 6"  (1.676 m)     Head Cir --      Peak Flow --      Pain Score --      Pain Loc --      Pain Edu? --      Excl. in GC? --     Constitutional: Alert and oriented. Well appearing and in no acute distress.appears younger than chronological age. Eyes: Conjunctivae are normal. PERRL. EOMI. Head: Atraumatic. Nose: No congestion/rhinnorhea. Mouth/Throat: Mucous membranes are moist.  Oropharynx non-erythematous. Neck: No stridor.   Cardiovascular: Normal rate, regular rhythm. Grossly normal heart sounds.  Good peripheral  circulation. Respiratory: Normal respiratory effort.  No retractions. Lungs CTAB. Gastrointestinal: Soft and nontender. No distention. No abdominal bruits. No CVA tenderness. Musculoskeletal: No lower extremity tenderness nor edema.  No joint effusions. Neurologic:  Normal speech and language. No gross focal neurologic deficits are appreciated.  Skin:  Skin is warm, dry and intact. No rash noted. Psychiatric: Mood and affect are normal. Speech and behavior are normal.  ____________________________________________   LABS (all labs ordered are listed, but only abnormal results are displayed)  Labs Reviewed  COMPREHENSIVE METABOLIC PANEL - Abnormal; Notable for the following:    BUN 29 (*)    Creatinine, Ser 1.55 (*)    ALT 8 (*)    GFR calc non Af Amer 36 (*)    GFR calc Af Amer 41 (*)    All other components within normal limits  CBC - Abnormal; Notable for the following:    RBC 3.69 (*)    Hemoglobin 11.9 (*)    HCT 36.3 (*)    RDW 15.3 (*)    All other components within normal limits  URINALYSIS COMPLETEWITH MICROSCOPIC ([facility] ONLY) - Abnormal; Notable for the following:    Color, Urine YELLOW (*)    APPearance CLEAR (*)    All other components within normal limits  GLUCOSE, CAPILLARY  CBG MONITORING, ED   ____________________________________________  EKG  ED ECG REPORT I, Travor Royce, the attending physician, personally viewed and interpreted this ECG.   Date: 02/28/2015  EKG Time: 18:45  Rate: 65  Rhythm: paced rhythm  Axis: left axis deviation  Intervals:paced rhythm  ST&T Change: no acute EKG changes to suggest acute ischemia  ____________________________________________  RADIOLOGY   No results found.  ____________________________________________   PROCEDURES  Procedure(s) performed: None  Critical Care performed: No ____________________________________________   INITIAL IMPRESSION / ASSESSMENT AND PLAN / ED COURSE  Pertinent labs & imaging  results that were available during my care of the patient were reviewed by me and considered in my medical decision making (see chart for details).  The patient is well-appearing and in no acute distress with normal vital signs and is afebrile.  He adamantly states that he feels fine and wants to go home.  I discussed this with his son as well as the patient's reassuring workup, and they both agree that he is fine to go home at this time.  I gave my usual and customary return precautions.  ____________________________________________  FINAL CLINICAL IMPRESSION(S) / ED DIAGNOSES  Final diagnoses:  Generalized weakness      NEW MEDICATIONS STARTED DURING THIS VISIT:  Discharge Medication List as of 02/28/2015  8:20 PM       Loleta Rose, MD 03/01/15 0002

## 2015-02-28 NOTE — ED Notes (Signed)
Pt's son states on Sunday and Monday pt was having some hallucinations and confusion and c/o dizziness, states symptoms seem to have improved today, pt denies any symptom at present

## 2015-02-28 NOTE — ED Notes (Signed)
Patient and family with no complaints at this time. Respirations even and unlabored. Skin warm/dry. Discharge instructions reviewed with patient and family at this time. Patient and family given opportunity to voice concerns/ask questions. IV removed per policy and band-aid applied to site. Patient discharged at this time and left Emergency Department with steady gait, via walker. Offered wheelchair assistance, family denied.

## 2015-06-21 DEATH — deceased

## 2015-09-30 IMAGING — CR RIGHT HIP - COMPLETE 2+ VIEW
1 series · 4 of 4 positions shown · non-contrast
Comparison: Radiographs 05/03/2012.  CT 06/07/2011.

CLINICAL DATA: Chronic right hip pain. Recent falls. History of
bilateral total hip arthroplasty.

EXAM:
RIGHT HIP - COMPLETE 2+ VIEW

[Series 1: ap · 0.17mm/px · 4 of 4 slices shown]
[im 1/4]
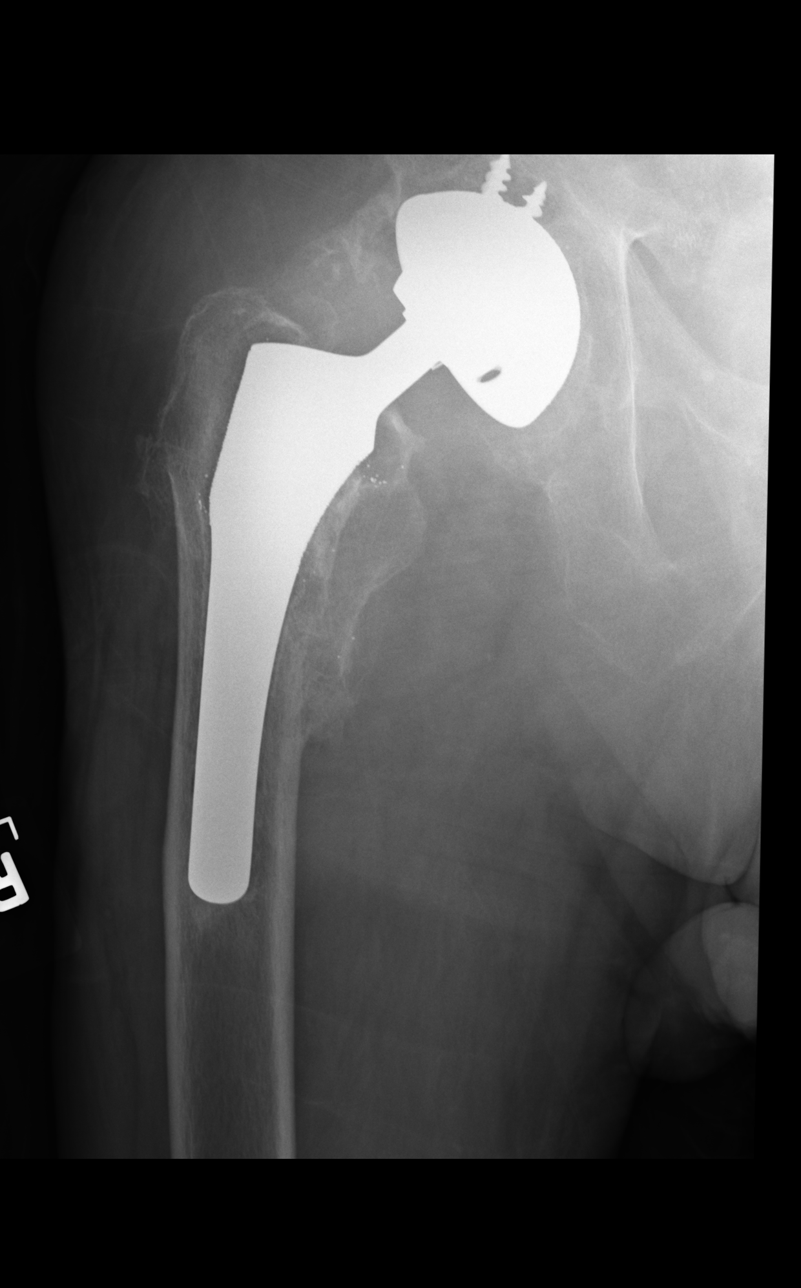
[im 2/4]
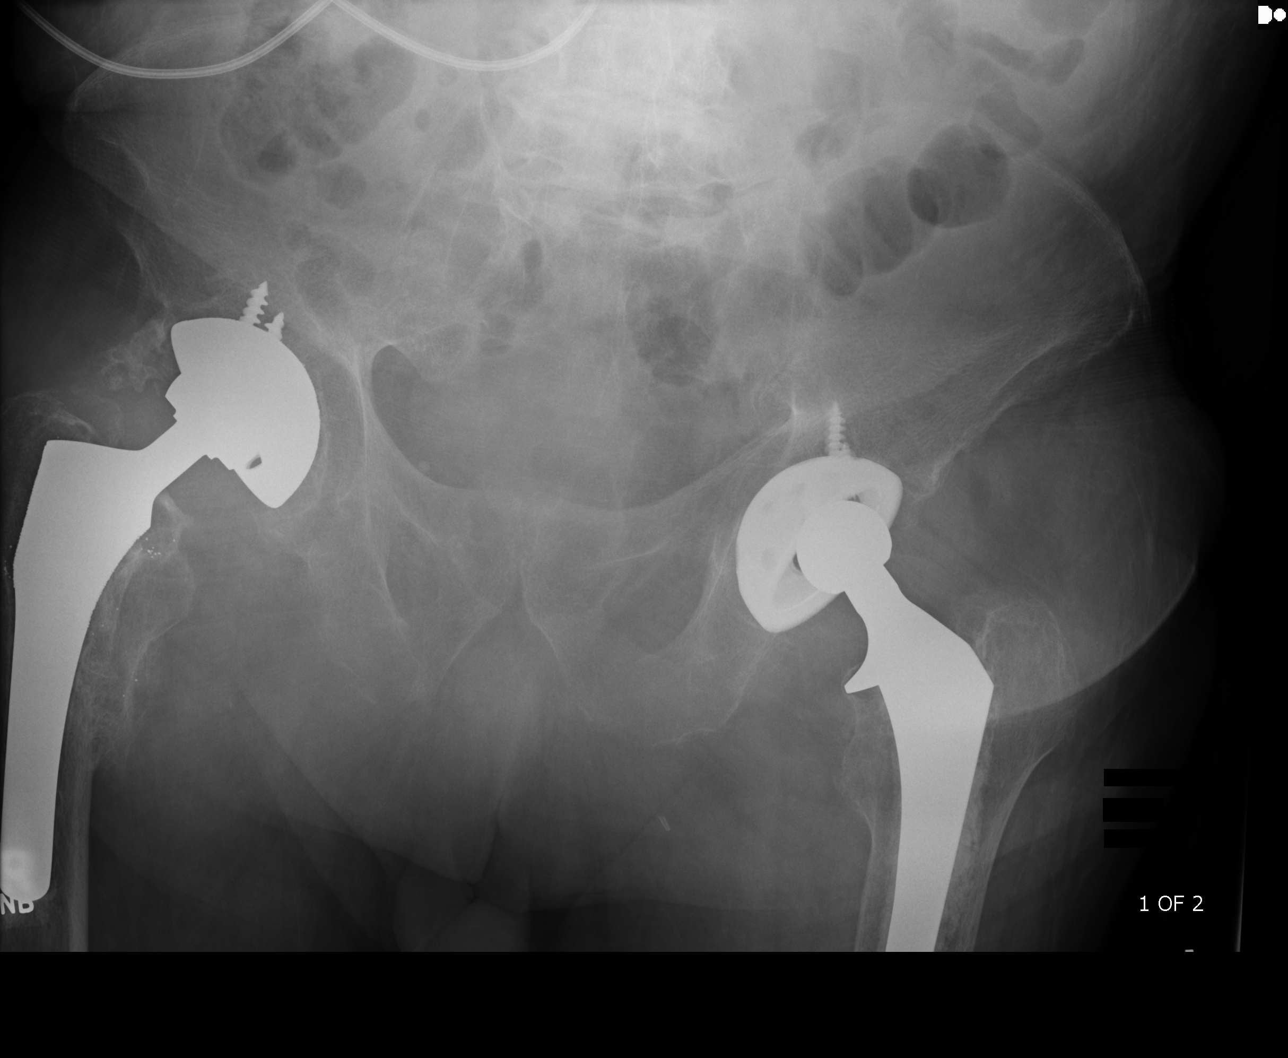
[im 3/4]
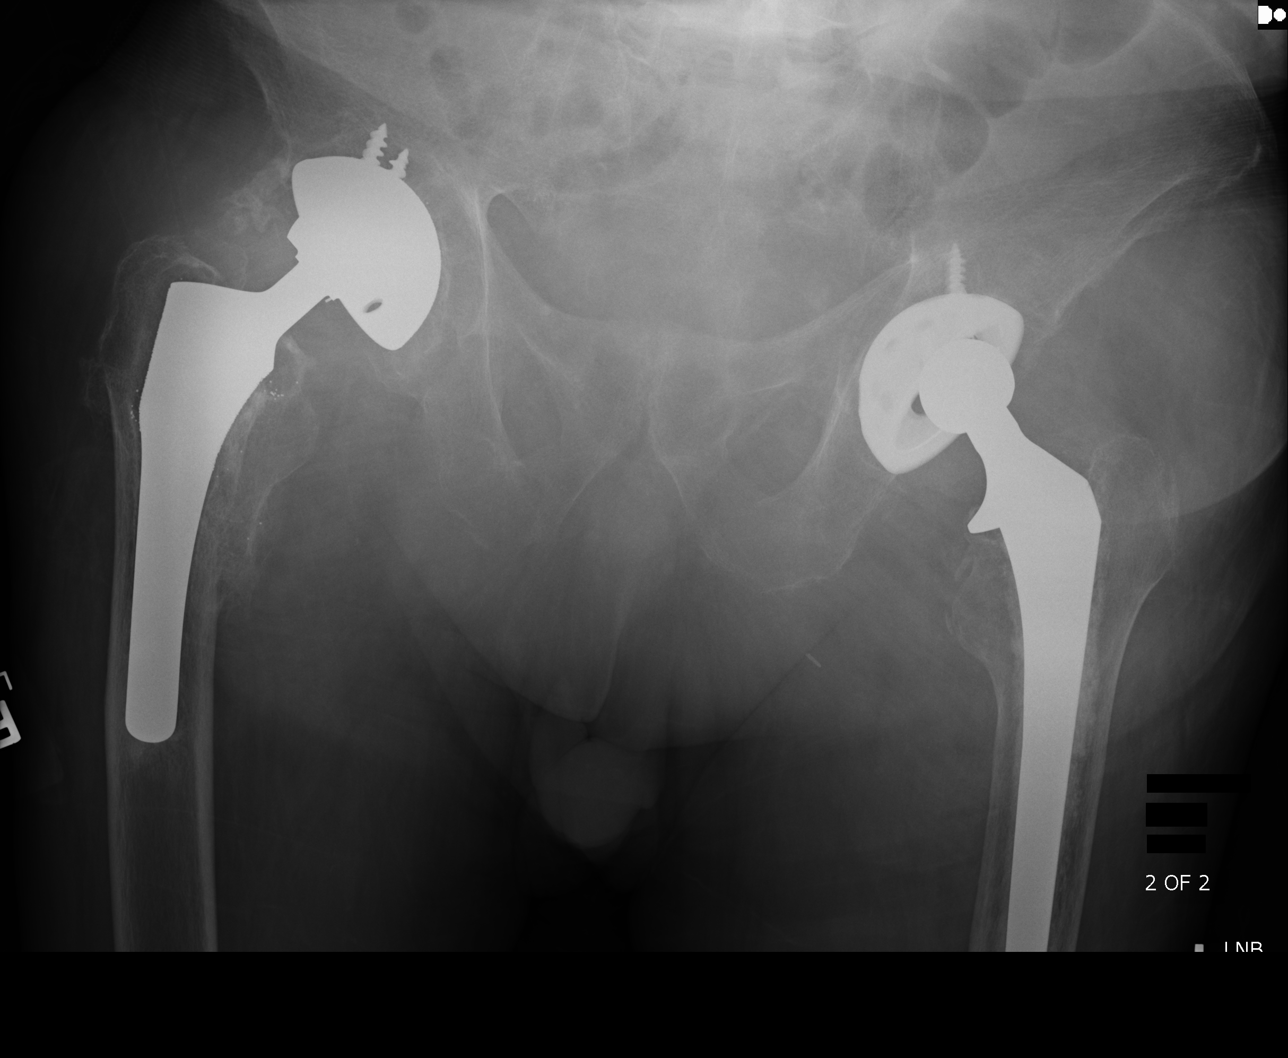
[im 4/4]
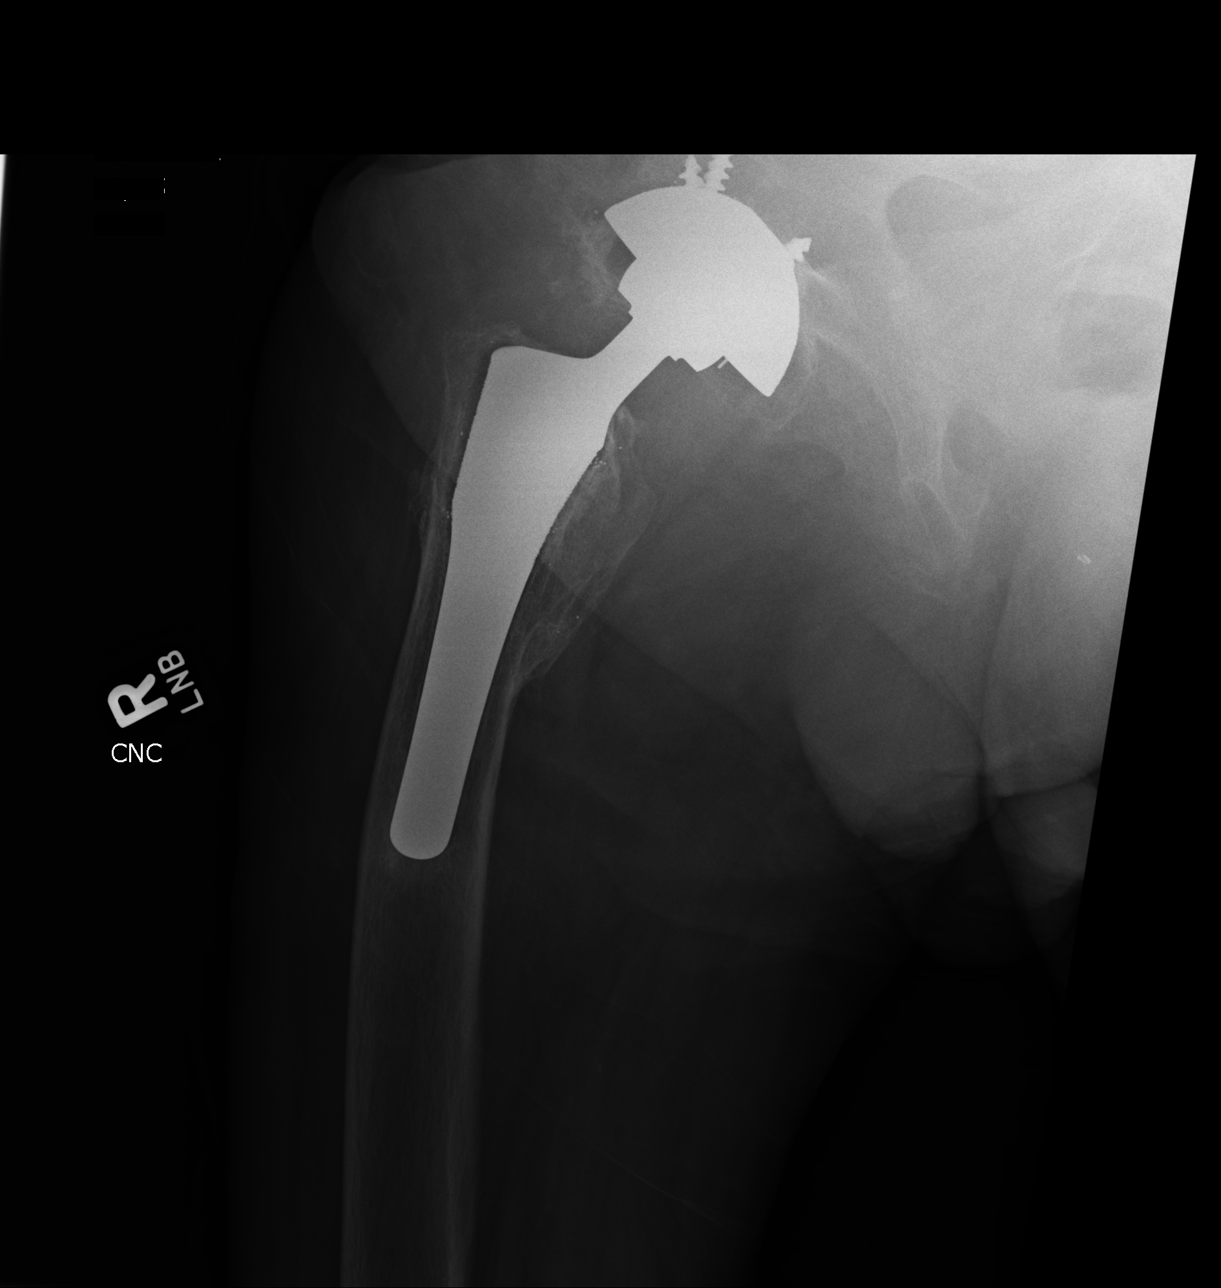

[4 of 4 positions shown; findings below may reference images not displayed]

FINDINGS: The bones are severely demineralized. Patient is status post
bilateral total hip arthroplasty. Previously demonstrated
intertrochanteric right femur fracture has healed with mild
posttraumatic deformity. There is suspected chronic loosening of the
right acetabular component, not grossly changed. There is no
evidence acute fracture or dislocation. The sacroiliac joints appear
ankylosed.
IMPRESSION: No evidence of acute fracture or dislocation status post bilateral
total hip arthroplasty. Suspected chronic loosening of the right
acetabular component.

If the patient has persistent hip pain or inability to bear weight,
follow up imaging may be warranted as hip/pelvic fractures can be
radiographically occult in the elderly.

## 2016-03-15 IMAGING — CR DG CHEST 1V PORT
1 series · 1 of 1 positions shown · non-contrast
Comparison: Portable chest radiographs 10/21/2013 and 06/13/2012.

CLINICAL DATA: Nausea, vomiting, fever and weakness for 2 days.
Initial encounter.

EXAM:
PORTABLE CHEST - 1 VIEW

[ap]
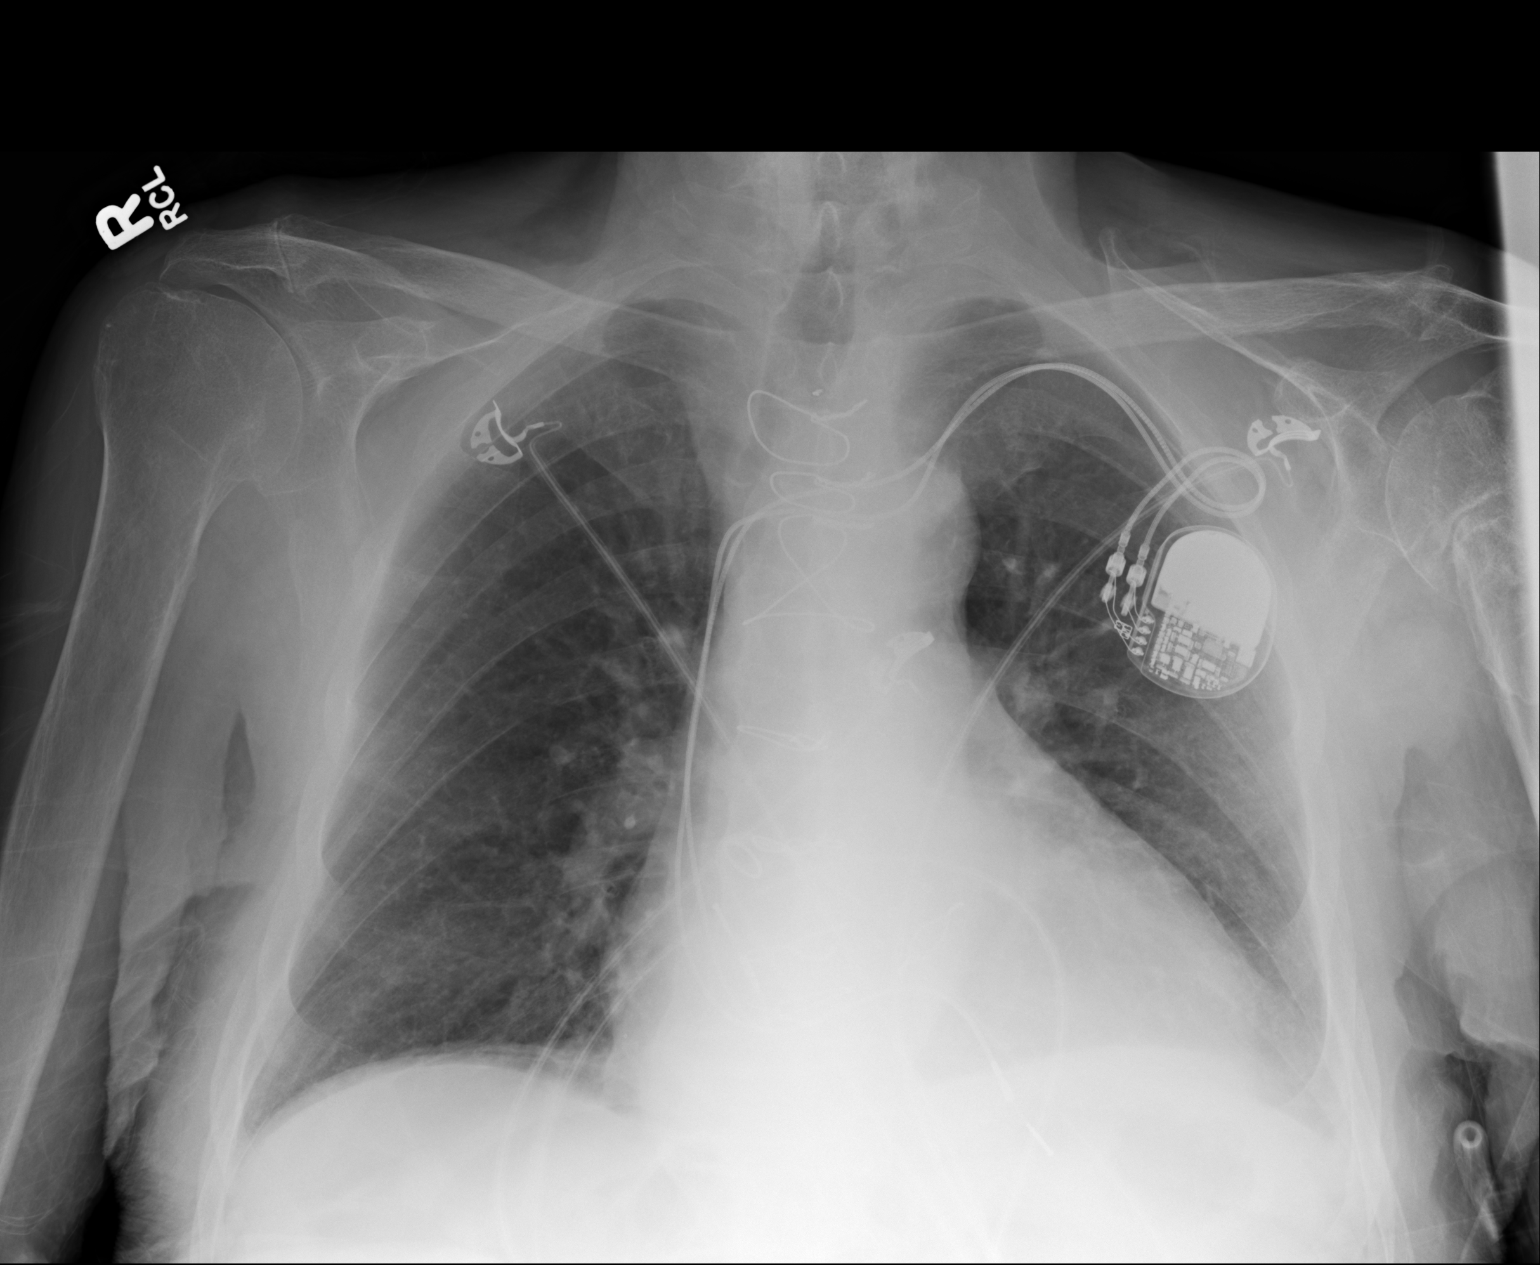

[1 of 1 positions shown; findings below may reference images not displayed]

FINDINGS: 0665 hr. Left subclavian pacemaker leads appear unchanged within the
right atrium and right ventricle. The heart appears stable status
post CABG. There is interval improved aeration of the lung bases.
There is no significant residual pleural effusion. Posttraumatic
deformity of the proximal left humerus appears unchanged. No acute
osseous findings are seen.
IMPRESSION: Interval improved aeration of both lung bases with resolved pleural
effusions. No acute cardiopulmonary process.
